# Patient Record
Sex: Male | Born: 1955 | Race: Black or African American | Hispanic: No | Marital: Single | Smoking: Current every day smoker
Health system: Southern US, Community
[De-identification: ages and names within clinical notes are randomized; demographics above are authoritative.]

## PROBLEM LIST (undated history)

## (undated) DIAGNOSIS — I639 Cerebral infarction, unspecified: Secondary | ICD-10-CM

## (undated) DIAGNOSIS — I10 Essential (primary) hypertension: Secondary | ICD-10-CM

## (undated) DIAGNOSIS — E7800 Pure hypercholesterolemia, unspecified: Secondary | ICD-10-CM

## (undated) DIAGNOSIS — F1910 Other psychoactive substance abuse, uncomplicated: Secondary | ICD-10-CM

## (undated) DIAGNOSIS — J439 Emphysema, unspecified: Secondary | ICD-10-CM

## (undated) DIAGNOSIS — H269 Unspecified cataract: Secondary | ICD-10-CM

## (undated) HISTORY — DX: Other psychoactive substance abuse, uncomplicated: F19.10

## (undated) HISTORY — DX: Emphysema, unspecified: J43.9

---

## 2015-02-09 ENCOUNTER — Other Ambulatory Visit (HOSPITAL_COMMUNITY): Payer: Self-pay | Admitting: Nurse Practitioner

## 2015-02-09 DIAGNOSIS — Z720 Tobacco use: Secondary | ICD-10-CM

## 2015-02-18 ENCOUNTER — Ambulatory Visit (HOSPITAL_COMMUNITY)
Admission: RE | Admit: 2015-02-18 | Discharge: 2015-02-18 | Disposition: A | Discharge status: Home / Self Care | Payer: Medicaid Other | Source: Ambulatory Visit | Attending: Nurse Practitioner | Admitting: Nurse Practitioner

## 2015-02-24 ENCOUNTER — Ambulatory Visit (HOSPITAL_COMMUNITY): Payer: Medicaid Other

## 2015-04-08 NOTE — Patient Instructions (Signed)
Your procedure is scheduled on: 04/13/2015  Report to [facility] at  31   AM.  Call this number if you have problems the morning of surgery: [phone]   Do not eat food or drink liquids :After Midnight.      Take these medicines the morning of surgery with A SIP OF WATER: lisinopril   Do not wear jewelry, make-up or nail polish.  Do not wear lotions, powders, or perfumes.   Do not shave 48 hours prior to surgery.  Do not bring valuables to the hospital.  Contacts, dentures or bridgework may not be worn into surgery.  Leave suitcase in the car. After surgery it may be brought to your room.  For patients admitted to the hospital, checkout time is 11:00 AM the day of discharge.   Patients discharged the day of surgery will not be allowed to drive home.  :     Please read over the following fact sheets that you were given: Coughing and Deep Breathing, Surgical Site Infection Prevention, Anesthesia Post-op Instructions and Care and Recovery After Surgery    Cataract A cataract is a clouding of the lens of the eye. When a lens becomes cloudy, vision is reduced based on the degree and nature of the clouding. Many cataracts reduce vision to some degree. Some cataracts make people more near-sighted as they develop. Other cataracts increase glare. Cataracts that are ignored and become worse can sometimes look white. The white color can be seen through the pupil. CAUSES   Aging. However, cataracts may occur at any age, even in newborns.   Certain drugs.   Trauma to the eye.   Certain diseases such as diabetes.   Specific eye diseases such as chronic inflammation inside the eye or a sudden attack of a rare form of glaucoma.   Inherited or acquired medical problems.  SYMPTOMS   Gradual, progressive drop in vision in the affected eye.   Severe, rapid visual loss. This most often happens when trauma is the cause.  DIAGNOSIS  To detect a cataract, an eye doctor examines the lens.  Cataracts are best diagnosed with an exam of the eyes with the pupils enlarged (dilated) by drops.  TREATMENT  For an early cataract, vision may improve by using different eyeglasses or stronger lighting. If that does not help your vision, surgery is the only effective treatment. A cataract needs to be surgically removed when vision loss interferes with your everyday activities, such as driving, reading, or watching TV. A cataract may also have to be removed if it prevents examination or treatment of another eye problem. Surgery removes the cloudy lens and usually replaces it with a substitute lens (intraocular lens, IOL).  At a time when both you and your doctor agree, the cataract will be surgically removed. If you have cataracts in both eyes, only one is usually removed at a time. This allows the operated eye to heal and be out of danger from any possible problems after surgery (such as infection or poor wound healing). In rare cases, a cataract may be doing damage to your eye. In these cases, your caregiver may advise surgical removal right away. The vast majority of people who have cataract surgery have better vision afterward. HOME CARE INSTRUCTIONS  If you are not planning surgery, you may be asked to do the following:  Use different eyeglasses.   Use stronger or brighter lighting.   Ask your eye doctor about reducing your medicine dose or changing medicines  if it is thought that a medicine caused your cataract. Changing medicines does not make the cataract go away on its own.   Become familiar with your surroundings. Poor vision can lead to injury. Avoid bumping into things on the affected side. You are at a higher risk for tripping or falling.   Exercise extreme care when driving or operating machinery.   Wear sunglasses if you are sensitive to bright light or experiencing problems with glare.  SEEK IMMEDIATE MEDICAL CARE IF:   You have a worsening or sudden vision loss.   You notice  redness, swelling, or increasing pain in the eye.   You have a fever.  Document Released: 09/19/2005 Document Revised: 09/08/2011 Document Reviewed: 05/13/2011 [facility] Patient Information 2012 [place].PATIENT INSTRUCTIONS POST-ANESTHESIA  IMMEDIATELY FOLLOWING SURGERY:  Do not drive or operate machinery for the first twenty four hours after surgery.  Do not make any important decisions for twenty four hours after surgery or while taking narcotic pain medications or sedatives.  If you develop intractable nausea and vomiting or a severe headache please notify your doctor immediately.  FOLLOW-UP:  Please make an appointment with your surgeon as instructed. You do not need to follow up with anesthesia unless specifically instructed to do so.  WOUND CARE INSTRUCTIONS (if applicable):  Keep a dry clean dressing on the anesthesia/puncture wound site if there is drainage.  Once the wound has quit draining you may leave it open to air.  Generally you should leave the bandage intact for twenty four hours unless there is drainage.  If the epidural site drains for more than 36-48 hours please call the anesthesia department.  QUESTIONS?:  Please feel free to call your physician or the hospital operator if you have any questions, and they will be happy to assist you.

## 2015-04-09 ENCOUNTER — Emergency Department (HOSPITAL_COMMUNITY): Payer: Medicaid Other

## 2015-04-09 ENCOUNTER — Inpatient Hospital Stay (HOSPITAL_COMMUNITY): Payer: Medicaid Other

## 2015-04-09 ENCOUNTER — Encounter (HOSPITAL_COMMUNITY): Payer: Self-pay

## 2015-04-09 ENCOUNTER — Other Ambulatory Visit: Payer: Self-pay

## 2015-04-09 ENCOUNTER — Inpatient Hospital Stay (HOSPITAL_COMMUNITY)
Admission: EM | Admit: 2015-04-09 | Discharge: 2015-04-10 | DRG: 066 | Disposition: A | Discharge status: Home / Self Care | Payer: Medicaid Other | Attending: Internal Medicine | Admitting: Internal Medicine

## 2015-04-09 ENCOUNTER — Encounter (HOSPITAL_COMMUNITY)
Admission: RE | Admit: 2015-04-09 | Discharge: 2015-04-09 | Disposition: A | Discharge status: Home / Self Care | Payer: Medicaid Other | Source: Ambulatory Visit | Attending: Ophthalmology | Admitting: Ophthalmology

## 2015-04-09 DIAGNOSIS — Z720 Tobacco use: Secondary | ICD-10-CM | POA: Diagnosis present

## 2015-04-09 DIAGNOSIS — E785 Hyperlipidemia, unspecified: Secondary | ICD-10-CM | POA: Diagnosis present

## 2015-04-09 DIAGNOSIS — I10 Essential (primary) hypertension: Secondary | ICD-10-CM | POA: Diagnosis present

## 2015-04-09 DIAGNOSIS — H269 Unspecified cataract: Secondary | ICD-10-CM | POA: Diagnosis present

## 2015-04-09 DIAGNOSIS — F17210 Nicotine dependence, cigarettes, uncomplicated: Secondary | ICD-10-CM | POA: Diagnosis present

## 2015-04-09 DIAGNOSIS — E780 Pure hypercholesterolemia: Secondary | ICD-10-CM | POA: Diagnosis present

## 2015-04-09 DIAGNOSIS — I639 Cerebral infarction, unspecified: Principal | ICD-10-CM | POA: Diagnosis present

## 2015-04-09 DIAGNOSIS — Z8673 Personal history of transient ischemic attack (TIA), and cerebral infarction without residual deficits: Secondary | ICD-10-CM

## 2015-04-09 HISTORY — DX: Unspecified cataract: H26.9

## 2015-04-09 HISTORY — DX: Pure hypercholesterolemia, unspecified: E78.00

## 2015-04-09 HISTORY — DX: Essential (primary) hypertension: I10

## 2015-04-09 LAB — COMPREHENSIVE METABOLIC PANEL
ALBUMIN: 4.2 g/dL (ref 3.5–5.0)
ALT: 38 U/L (ref 17–63)
AST: 24 U/L (ref 15–41)
Alkaline Phosphatase: 59 U/L (ref 38–126)
Anion gap: 8 (ref 5–15)
BILIRUBIN TOTAL: 0.6 mg/dL (ref 0.3–1.2)
BUN: 15 mg/dL (ref 6–20)
CALCIUM: 9.1 mg/dL (ref 8.9–10.3)
CHLORIDE: 102 mmol/L (ref 101–111)
CO2: 26 mmol/L (ref 22–32)
CREATININE: 1.03 mg/dL (ref 0.61–1.24)
GFR calc Af Amer: >60 mL/min (ref >60)
Glucose, Bld: 108 mg/dL — ABNORMAL HIGH (ref 65–99)
Potassium: 4.4 mmol/L (ref 3.5–5.1)
SODIUM: 136 mmol/L (ref 135–145)
Total Protein: 8.1 g/dL (ref 6.5–8.1)

## 2015-04-09 LAB — GLUCOSE, CAPILLARY
GLUCOSE-CAPILLARY: 151 mg/dL — AB (ref 65–99)
Glucose-Capillary: 126 mg/dL — ABNORMAL HIGH (ref 65–99)
Glucose-Capillary: 106 mg/dL — ABNORMAL HIGH (ref 65–99)

## 2015-04-09 LAB — CBC WITH DIFFERENTIAL/PLATELET
BASOS ABS: 0 10*3/uL (ref 0.0–0.1)
Basophils Absolute: 0 10*3/uL (ref 0.0–0.1)
Basophils Relative: 0 % (ref 0–1)
Basophils Relative: 0 % (ref 0–1)
Eosinophils Absolute: 0 10*3/uL (ref 0.0–0.7)
Eosinophils Absolute: 0 10*3/uL (ref 0.0–0.7)
Eosinophils Relative: 0 % (ref 0–5)
Eosinophils Relative: 0 % (ref 0–5)
HCT: 41.4 % (ref 39.0–52.0)
HEMATOCRIT: 42.7 % (ref 39.0–52.0)
HEMOGLOBIN: 14.7 g/dL (ref 13.0–17.0)
HEMOGLOBIN: 14.4 g/dL (ref 13.0–17.0)
LYMPHS ABS: 1.6 10*3/uL (ref 0.7–4.0)
LYMPHS PCT: 32 % (ref 12–46)
LYMPHS PCT: 28 % (ref 12–46)
Lymphs Abs: 1.9 10*3/uL (ref 0.7–4.0)
MCH: 29.2 pg (ref 26.0–34.0)
MCH: 29.3 pg (ref 26.0–34.0)
MCHC: 34.4 g/dL (ref 30.0–36.0)
MCHC: 34.8 g/dL (ref 30.0–36.0)
MCV: 84.9 fL (ref 78.0–100.0)
MCV: 84.3 fL (ref 78.0–100.0)
Monocytes Absolute: 0.5 10*3/uL (ref 0.1–1.0)
Monocytes Absolute: 0.5 10*3/uL (ref 0.1–1.0)
Monocytes Relative: 9 % (ref 3–12)
Monocytes Relative: 9 % (ref 3–12)
NEUTROS ABS: 3.4 10*3/uL (ref 1.7–7.7)
NEUTROS ABS: 3.6 10*3/uL (ref 1.7–7.7)
NEUTROS PCT: 63 % (ref 43–77)
Neutrophils Relative %: 59 % (ref 43–77)
Platelets: 281 10*3/uL (ref 150–400)
Platelets: 256 10*3/uL (ref 150–400)
RBC: 5.03 MIL/uL (ref 4.22–5.81)
RBC: 4.91 MIL/uL (ref 4.22–5.81)
RDW: 13.4 % (ref 11.5–15.5)
RDW: 13.4 % (ref 11.5–15.5)
WBC: 5.9 10*3/uL (ref 4.0–10.5)
WBC: 5.7 10*3/uL (ref 4.0–10.5)

## 2015-04-09 LAB — BASIC METABOLIC PANEL
Anion gap: 7 (ref 5–15)
BUN: 15 mg/dL (ref 6–20)
CHLORIDE: 101 mmol/L (ref 101–111)
CO2: 28 mmol/L (ref 22–32)
Calcium: 9.6 mg/dL (ref 8.9–10.3)
Creatinine, Ser: 1.03 mg/dL (ref 0.61–1.24)
GFR calc non Af Amer: >60 mL/min (ref >60)
GLUCOSE: 114 mg/dL — AB (ref 65–99)
POTASSIUM: 4.8 mmol/L (ref 3.5–5.1)
Sodium: 136 mmol/L (ref 135–145)

## 2015-04-09 LAB — TSH: TSH: 1.092 u[IU]/mL (ref 0.350–4.500)

## 2015-04-09 LAB — VITAMIN B12: VITAMIN B 12: 447 pg/mL (ref 180–914)

## 2015-04-09 MED ORDER — ASPIRIN 325 MG PO TABS
325.0000 mg | ORAL_TABLET | Freq: Every day | ORAL | Status: DC
  Administered 2015-04-10: 325 mg via ORAL
  Filled 2015-04-09: qty 1

## 2015-04-09 MED ORDER — HEPARIN SODIUM (PORCINE) 5000 UNIT/ML IJ SOLN
5000.0000 [IU] | Freq: Three times a day (TID) | INTRAMUSCULAR | Status: DC
  Administered 2015-04-09 – 2015-04-10 (×3): 5000 [IU] via SUBCUTANEOUS
  Filled 2015-04-09 (×3): qty 1

## 2015-04-09 MED ORDER — ASPIRIN-DIPYRIDAMOLE ER 25-200 MG PO CP12
1.0000 | ORAL_CAPSULE | Freq: Two times a day (BID) | ORAL | Status: DC
  Administered 2015-04-09: 1 via ORAL
  Filled 2015-04-09 (×2): qty 1

## 2015-04-09 MED ORDER — STROKE: EARLY STAGES OF RECOVERY BOOK
Freq: Once | Status: AC
  Administered 2015-04-09: 17:00:00
  Filled 2015-04-09: qty 1

## 2015-04-09 MED ORDER — ATORVASTATIN CALCIUM 40 MG PO TABS
80.0000 mg | ORAL_TABLET | Freq: Every day | ORAL | Status: DC
  Administered 2015-04-09: 80 mg via ORAL
  Filled 2015-04-09: qty 2

## 2015-04-09 NOTE — H&P (Signed)
[facility] History and Physical  Drew Cruz XNT:[id number] DOB: [birth year]    PCP:   Alphia Kava   Chief Complaint: left facial numbness and left upper extremity paresthesia.   HPI: Drew Cruz is an 60 y.o. male with hx of HLD, HTN, bilateral cataracts with very poor vision, planned to have cataract surgery soon, presented to the ER with 2 days hx of left facial numbness, left upper extremity numbness with no slurred speech, focal weakness, HA, or changes in his vision.  He has fixed symptoms with ictus about 2 days ago, and is out of window for TPA.  He has been on Lipitor, lower dose, and compliant with his ASA.  CT of his head showe superior right parietal hypodensity of indetermined age, likely subacute or acute infarct.  MRI/MRA of the confirmed new right thalamus infarct, along with prior right thalamus infarct, and showed distal carotid stenosis to 75%, along with moderate to severe A1 and P2 segment stenosis as well.  Hospitalist was asked to admit him for further stroke work up..   Rewiew of Systems:  Constitutional: Negative for malaise, fever and chills. No significant weight loss or weight gain Eyes: Negative for eye pain, redness and discharge, diplopia, visual changes, or flashes of light. ENMT: Negative for ear pain, hoarseness, nasal congestion, sinus pressure and sore throat. No headaches; tinnitus, drooling, or problem swallowing. Cardiovascular: Negative for chest pain, palpitations, diaphoresis, dyspnea and peripheral edema. ; No orthopnea, PND Respiratory: Negative for cough, hemoptysis, wheezing and stridor. No pleuritic chestpain. Gastrointestinal: Negative for nausea, vomiting, diarrhea, constipation, abdominal pain, melena, blood in stool, hematemesis, jaundice and rectal bleeding.    Genitourinary: Negative for frequency, dysuria, incontinence,flank pain and hematuria; Musculoskeletal: Negative for back pain and neck pain. Negative for swelling and  trauma.;  Skin: . Negative for pruritus, rash, abrasions, bruising and skin lesion.; ulcerations Neuro: Negative for headache, lightheadedness and neck stiffness. Negative for weakness, altered level of consciousness , altered mental status, extremity weakness, burning feet, involuntary movement, seizure and syncope.  Psych: negative for anxiety, depression, insomnia, tearfulness, panic attacks, hallucinations, paranoia, suicidal or homicidal ideation    Past Medical History  Diagnosis Date  . Hypertension   . Hypercholesteremia   . Cataracts, bilateral     History reviewed. No pertinent past surgical history.  Medications:  HOME MEDS: Prior to Admission medications   Medication Sig Start Date End Date Taking? Authorizing Provider  atorvastatin (LIPITOR) 20 MG tablet Take 1 tablet by mouth daily. 03/04/15  Yes Historical Provider, MD  [facility] ASPIRIN LOW DOSE 81 MG EC tablet Take 81 mg by mouth daily. 03/04/15  Yes Historical Provider, MD  lisinopril (PRINIVIL,ZESTRIL) 10 MG tablet Take 10 mg by mouth daily. 03/04/15  Yes Historical Provider, MD     Allergies:  No Known Allergies  Social History:   reports that he has been smoking Cigarettes.  He has a 24 pack-year smoking history. He does not have any smokeless tobacco history on file. He reports that he drinks about 14.4 oz of alcohol per week. He reports that he does not use illicit drugs.  Family History: No family history on file.   Physical Exam: Filed Vitals:   04/09/15 1430 04/09/15 1500 04/09/15 1530 04/09/15 1600  BP: 152/102 161/88 131/91 130/93  Pulse: 90 84 103 95  Temp:      TempSrc:      Resp:      Height:      Weight:  SpO2: 98% 94% 96% 97%   Blood pressure 130/93, pulse 95, temperature 98.1 F (36.7 C), temperature source Oral, resp. rate 20, height 5\' 11"  (1.803 m), weight 90.266 kg (199 lb), SpO2 97 %.  GEN:  Pleasant  patient lying in the stretcher in no acute distress; cooperative with exam. PSYCH:   alert and oriented x4; does not appear anxious or depressed; affect is appropriate. HEENT: Mucous membranes pink and anicteric; PERRLA; EOM intact; no cervical lymphadenopathy nor thyromegaly or carotid bruit; no JVD; There were no stridor. Neck is very supple. Breasts:: Not examined CHEST WALL: No tenderness CHEST: Normal respiration, clear to auscultation bilaterally.  HEART: Regular rate and rhythm.  There are no murmur, rub, or gallops.   BACK: No kyphosis or scoliosis; no CVA tenderness ABDOMEN: soft and non-tender; no masses, no organomegaly, normal abdominal bowel sounds; no pannus; no intertriginous candida. There is no rebound and no distention. Rectal Exam: Not done EXTREMITIES: No bone or joint deformity; age-appropriate arthropathy of the hands and knees; no edema; no ulcerations.  There is no calf tenderness. Genitalia: not examined PULSES: 2+ and symmetric SKIN: Normal hydration no rash or ulceration CNS: Cranial nerves 2-12 grossly intact no focal lateralizing neurologic deficit.  Speech is fluent; uvula elevated with phonation, facial symmetry and tongue midline. DTR are normal bilaterally, cerebella exam is intact, barbinski is negative and strengths are equaled bilaterally.  Decreased sensation of the left check and left upper extremities.    Labs on Admission:  Basic Metabolic Panel:  Recent Labs Lab 04/09/15 0945 04/09/15 1102  NA 136 136  K 4.8 4.4  CL 101 102  CO2 28 26  GLUCOSE 114* 108*  BUN 15 15  CREATININE 1.03 1.03  CALCIUM 9.6 9.1   Liver Function Tests:  Recent Labs Lab 04/09/15 1102  AST 24  ALT 38  ALKPHOS 59  BILITOT 0.6  PROT 8.1  ALBUMIN 4.2   No results for input(s): LIPASE, AMYLASE in the last 168 hours. No results for input(s): AMMONIA in the last 168 hours. CBC:  Recent Labs Lab 04/09/15 0945 04/09/15 1102  WBC 5.9 5.7  NEUTROABS 3.4 3.6  HGB 14.7 14.4  HCT 42.7 41.4  MCV 84.9 84.3  PLT 281 256   Cardiac Enzymes: No  results for input(s): CKTOTAL, CKMB, CKMBINDEX, TROPONINI in the last 168 hours.  CBG: No results for input(s): GLUCAP in the last 168 hours.   Radiological Exams on Admission: Ct Head Wo Contrast  04/09/2015   CLINICAL DATA:  59 year old hypertensive male with hypercholesterolemia presenting with left-sided facial and arm numbness. Blurred vision. Symptoms originated 04/06/2015. Initial encounter.  EXAM: CT HEAD WITHOUT CONTRAST  TECHNIQUE: Contiguous axial images were obtained from the base of the skull through the vertex without intravenous contrast.  COMPARISON:  None.  FINDINGS: Superior right parietal hypodensity may represent an infarct of indeterminate age, possibly acute or subacute. MR may be considered for further delineation.  Probable partial volume averaging superior aspect left lateral ventricle (image 21).  No intracranial hemorrhage.  No hydrocephalus.  No intracranial mass lesion noted on this unenhanced exam.  Partial calcification internal carotid artery cavernous segment.  Visualized aspects of the orbits grossly within normal limits.  No calvarial abnormality noted.  IMPRESSION: Superior right parietal hypodensity may represent an infarct of indeterminate age, possibly acute or subacute. MR may be considered for further delineation.   Electronically Signed   By: Genia Del M.D.   On: 04/09/2015 11:04   Mr Jodene Nam  Head Wo Contrast  04/09/2015   CLINICAL DATA:  New onset of left-sided facial numbness and hand numbness beginning 3 days ago.  EXAM: MRI HEAD WITHOUT CONTRAST  MRA HEAD WITHOUT CONTRAST  TECHNIQUE: Multiplanar, multiecho pulse sequences of the brain and surrounding structures were obtained without intravenous contrast. Angiographic images of the head were obtained using MRA technique without contrast.  COMPARISON:  CT head without contrast from the same day.  FINDINGS: MRI HEAD FINDINGS  A 10 mm acute nonhemorrhagic infarct is noted within the lateral right thalamus. T2  changes are associated with the acute infarct. An additional remote lacunar infarct is seen posteriorly in the right thalamus. A remote right parietal lobe infarct is evident. Mild periventricular T2 changes are seen bilaterally.  Flow is present in the major intracranial arteries. The globes and orbits are intact. A posterior left ethmoid air cell is opacified. The paranasal sinuses and mastoid air cells are otherwise clear.  The ventricles are of normal size. No significant extraaxial fluid collection is present.  The skullbase is within normal limits. Midline structures are unremarkable.  MRA HEAD FINDINGS  The left internal carotid artery is within normal limits from the high cervical segments through the ICA terminus. The right internal carotid artery demonstrates moderate stenosis in the distal cavernous segment. The lumen is narrowed to 0.9 mm compared to the immediate poststenotic segment is 3.8 mm. There is moderate stenosis of proximal left A1 segment. The right A1 segment and both M1 segments are normal. The anterior communicating artery is patent. The MCA bifurcations are intact. There is segmental attenuation of distal MCA branch vessels bilaterally. The ACA branch vessels demonstrate mild distal segmental narrowing as well.  The vertebral arteries are codominant. The PICA origins are visualized and normal. The basilar artery is within normal limits. Both posterior cerebral arteries originate from the basilar tip. Moderate P2 segment stenoses are noted bilaterally. There is moderate attenuation of distal PCA branch vessels.  IMPRESSION: 1. 10 mm acute nonhemorrhagic infarct in the lateral right thalamus. 2. Additional remote lacunar infarct of the right thalamus. 3. Remote right parietal lobe infarct. 4. Periventricular T2 changes bilaterally are somewhat advanced, suggesting microvascular ischemic disease. 5. Moderate to severe stenosis of the distal cavernous right internal carotid artery of up to  75%. 6. Moderate to severe proximal left A1 segment stenosis. 7. Moderate P2 segment stenoses bilaterally. 8. Mild diffuse small vessel disease.  These results were called by telephone at the time of interpretation on 04/09/2015 at 2:25 pm to Dr. Milton Ferguson , who verbally acknowledged these results.   Electronically Signed   By: San Morelle M.D.   On: 04/09/2015 14:57   Mr Brain Wo Contrast  04/09/2015   CLINICAL DATA:  New onset of left-sided facial numbness and hand numbness beginning 3 days ago.  EXAM: MRI HEAD WITHOUT CONTRAST  MRA HEAD WITHOUT CONTRAST  TECHNIQUE: Multiplanar, multiecho pulse sequences of the brain and surrounding structures were obtained without intravenous contrast. Angiographic images of the head were obtained using MRA technique without contrast.  COMPARISON:  CT head without contrast from the same day.  FINDINGS: MRI HEAD FINDINGS  A 10 mm acute nonhemorrhagic infarct is noted within the lateral right thalamus. T2 changes are associated with the acute infarct. An additional remote lacunar infarct is seen posteriorly in the right thalamus. A remote right parietal lobe infarct is evident. Mild periventricular T2 changes are seen bilaterally.  Flow is present in the major intracranial arteries. The globes and  orbits are intact. A posterior left ethmoid air cell is opacified. The paranasal sinuses and mastoid air cells are otherwise clear.  The ventricles are of normal size. No significant extraaxial fluid collection is present.  The skullbase is within normal limits. Midline structures are unremarkable.  MRA HEAD FINDINGS  The left internal carotid artery is within normal limits from the high cervical segments through the ICA terminus. The right internal carotid artery demonstrates moderate stenosis in the distal cavernous segment. The lumen is narrowed to 0.9 mm compared to the immediate poststenotic segment is 3.8 mm. There is moderate stenosis of proximal left A1 segment. The  right A1 segment and both M1 segments are normal. The anterior communicating artery is patent. The MCA bifurcations are intact. There is segmental attenuation of distal MCA branch vessels bilaterally. The ACA branch vessels demonstrate mild distal segmental narrowing as well.  The vertebral arteries are codominant. The PICA origins are visualized and normal. The basilar artery is within normal limits. Both posterior cerebral arteries originate from the basilar tip. Moderate P2 segment stenoses are noted bilaterally. There is moderate attenuation of distal PCA branch vessels.  IMPRESSION: 1. 10 mm acute nonhemorrhagic infarct in the lateral right thalamus. 2. Additional remote lacunar infarct of the right thalamus. 3. Remote right parietal lobe infarct. 4. Periventricular T2 changes bilaterally are somewhat advanced, suggesting microvascular ischemic disease. 5. 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 6. Moderate to severe proximal left A1 segment stenosis. 7. Moderate P2 segment stenoses bilaterally. 8. Mild diffuse small vessel disease.  These results were called by telephone at the time of interpretation on 04/09/2015 at 2:25 pm to Dr. Milton Ferguson , who verbally acknowledged these results.   Electronically Signed   By: San Morelle M.D.   On: 04/09/2015 14:57   Assessment/Plan Present on Admission:  . CVA (cerebral infarction) . HTN (hypertension) . Tobacco abuse . Acute CVA (cerebrovascular accident)  PLAN:  Will admit for complete stroke work up.  Since he has been on ASA, and has microvascular ischemic disease as well, will change to Aggrenox.  His statin will be at max dose, so will increase to 80mg  of Lipitor.  Will check for hypercoagulable panel as well.  He is advised to quit smoking.  Will hold his Lisinopril for now.  His BP is controlled.  I have consulted neurology for further recommendation on his cerebral circulation stenosis.   Thanks.   Other plans as per orders.  Code Status: FULL Haskel Khan, MD. [facility] Pager [phone] 7pm to 7am.  04/09/2015, 4:19 PM

## 2015-04-09 NOTE — Pre-Procedure Instructions (Signed)
Pt complained of having numbness in left side of face and lip and numbness and weakness in left hand since 04-06-15. Advised to call Primary MD or to report to the ED for further evaluation. Pt called Dr. Deboraha Sprang office who advised pt to go to the ED. Pt transported via wheelchair to ED accompanied by Idamae Lusher, Nurse Aid and T. Doss, CNA.

## 2015-04-09 NOTE — ED Provider Notes (Signed)
CSN: [id number]     Arrival date & time 04/09/15  1001 History  This chart was scribed for Drew Ferguson, MD by Rayna Sexton, ED scribe. This patient was seen in room APA09/APA09 and the patient's care was started at 10:48 AM.     Chief Complaint  Patient presents with  . Numbness   Patient is a 59 y.o. male presenting with weakness. The history is provided by the patient and a caregiver. No language interpreter was used.  Weakness This is a new problem. The current episode started more than 2 days ago. The problem occurs constantly. The problem has not changed since onset.Nothing aggravates the symptoms. Nothing relieves the symptoms. He has tried nothing for the symptoms.    HPI Comments: Drew Cruz is a 59 y.o. male, with a history of smoking and HTN, who presents to the Emergency Department complaining of generalized, constant, numbness in his left hand and face with onset 3 days ago. He denies trouble ambulating, talking, swallowing or any other associated symptoms. Pt's caregiver confirms no other recent changes in his behavior or symptoms.   Past Medical History  Diagnosis Date  . Hypertension   . Hypercholesteremia   . Cataracts, bilateral    History reviewed. No pertinent past surgical history. No family history on file. History  Substance Use Topics  . Smoking status: Current Every Day Smoker -- 0.50 packs/day for 48 years    Types: Cigarettes  . Smokeless tobacco: Not on file  . Alcohol Use: 14.4 oz/week    24 Cans of beer per week    Review of Systems  Constitutional: Negative for appetite change and fatigue.  HENT: Negative for congestion, ear discharge, sinus pressure and trouble swallowing.   Eyes: Negative for discharge.  Respiratory: Negative for cough.   Gastrointestinal: Negative for diarrhea.  Genitourinary: Negative for frequency and hematuria.  Musculoskeletal: Negative for back pain.  Skin: Negative for rash.  Neurological: Positive for weakness and  numbness. Negative for seizures.  Psychiatric/Behavioral: Negative for hallucinations.      Allergies  Review of patient's allergies indicates no known allergies.  Home Medications   Prior to Admission medications   Medication Sig Start Date End Date Taking? Authorizing Provider  atorvastatin (LIPITOR) 20 MG tablet Take 1 tablet by mouth daily. 03/04/15   Historical Provider, MD  [facility] ASPIRIN LOW DOSE 81 MG EC tablet Take 81 mg by mouth daily. 03/04/15   Historical Provider, MD  lisinopril (PRINIVIL,ZESTRIL) 10 MG tablet Take 10 mg by mouth daily. 03/04/15   Historical Provider, MD   There were no vitals taken for this visit. Physical Exam  Constitutional: He is oriented to person, place, and time. He appears well-developed. He appears lethargic.  HENT:  Head: Normocephalic.  Eyes: Conjunctivae and EOM are normal. No scleral icterus.  Neck: Neck supple. No thyromegaly present.  Cardiovascular: Normal rate and regular rhythm.  Exam reveals no gallop and no friction rub.   No murmur heard. Pulmonary/Chest: No stridor. He has no wheezes. He has no rales. He exhibits no tenderness.  Abdominal: He exhibits no distension. There is no tenderness. There is no rebound.  Musculoskeletal: Normal range of motion. He exhibits no edema.  Lymphadenopathy:    He has no cervical adenopathy.  Neurological: He is oriented to person, place, and time. He appears lethargic. A sensory deficit is present. He exhibits normal muscle tone. Coordination normal.  Mild decreased sensation in left face and left palm;  Skin: No rash noted. No erythema.  Psychiatric: He has a normal mood and affect. His behavior is normal.    ED Course  Procedures  DIAGNOSTIC STUDIES: Oxygen Saturation is 100% on RA, normal by my interpretation.    COORDINATION OF CARE: 10:50 AM Discussed treatment plan with pt at bedside and pt agreed to plan.  Labs Review Labs Reviewed - No data to display  Imaging Review No results  found.   EKG Interpretation None      MDM   Final diagnoses:  None    cva admit  The chart was scribed for me under my direct supervision.  I personally performed the history, physical, and medical decision making and all procedures in the evaluation of this patient.Drew Ferguson, MD 04/09/15 [phone]

## 2015-04-09 NOTE — ED Notes (Signed)
Complain of left hand, arm and left side of face numb since July 4th.

## 2015-04-09 NOTE — Consult Note (Signed)
[place] A. Merlene Laughter, MD     [email or web address]          Drew Cruz is an 59 y.o. male.   ASSESSMENT/PLAN: 1. Acute right thalamic infarct presented with numbness tingling of the left facial region and left upper extremity. Risk factor of hypertension and age. 2. Previous lacunar infarcts. 3. Dyslipidemia. 4. Hypertension. 5. Severe cataracts due for surgery next week.   RECOMMENDATION: I think we should increase the patient's aspirin from 81 mg to 325 mg rather than using Aggrenox. This is likely less expensive than the given his once a day dosing would likely increase compliance.  Blood pressure management and continue with statin medication. Agree with the current workup. Additional labs will be obtained for RPR, vitamin B12 level, thyroid function test and HIV.  I would recommend that the patient's elective surgery be held for the next month until after the acute stroke phase.  The patient is a 59 year old white male who presents with a 3 day history of facial numbness on the left side and left hand numbness. For unknown reasons, the patient did not seek medical attention until today. He decided symmetrical attention because his symptoms were not improving. The patient does not report weakness. He does not report dysarthria or dysphasia. He tells me that he is compliant with his medications including aspirin 81 mg. The patient does not report other symptoms such as headache, dysarthria, dysphagia, dyspnea, chest pain or shortness of breath. The patient does not report any GI or GU symptoms. The review of systems otherwise negative.  GENERAL: This is a very pleasant man in no acute distress.  HEENT: Supple. Atraumatic normocephalic. Severe dense cataracts bilaterally. He tells me that his vision is poor because of this.  ABDOMEN: soft  EXTREMITIES: No edema   BACK: Normal.  SKIN: Normal by inspection.    MENTAL STATUS: Alert and oriented. Speech, language  and cognition are generally intact. Judgment and insight normal.   CRANIAL NERVES: Pupils are R- 4 mm; L- 3 mm both are round and reactive to light and accommodation; extra ocular movements are full, there is no significant nystagmus; visual fields - unable to count fingers bilaterally; upper and lower facial muscles are normal in strength and symmetric, there is no flattening of the nasolabial folds; tongue is midline; uvula is midline; shoulder elevation is normal.  MOTOR: Normal tone, bulk and strength; no pronator drift.  COORDINATION: Left finger to nose is normal, right finger to nose is normal, No rest tremor; no intention tremor; no postural tremor; no bradykinesia.  REFLEXES: Deep tendon reflexes are symmetrical and normal. Babinski reflexes are flexor bilaterally.   SENSATION: Reduced sensation involving the periorbital and cheek region on the left side condition to the left-hand region.     The brain MRI and MRA are reviewed in person. There is acute infarcts in the lateral thalamic new client on the right side on diffusion imaging. More medially there is reduced signal seen on T2 also involving the right thalamic region suggestive of a chronic infarct. Type. There is increased signal seen in the right parietal cortical region on T2/FLAIR imaging suggestive of a prior infarct. There is mild to periventricular and deep white matter leukoencephalopathy somewhat more increased for age. There is also mild to moderate generalized atrophy. There is significant to dropout of the left A1 segment of the ACA. There is also suggestion of reduced signal involving the distal right cavernous ICA although this is an area with the potential  artifact.   Blood pressure 148/89, pulse 95, temperature 98.5 F (36.9 C), temperature source Oral, resp. rate 20, height '5\' 11"'  (1.803 m), weight 88.769 kg (195 lb 11.2 oz), SpO2 98 %.  Past Medical History  Diagnosis Date  . Hypertension   .  Hypercholesteremia   . Cataracts, bilateral     History reviewed. No pertinent past surgical history.  No family history on file.  Social History:  reports that he has been smoking Cigarettes.  He has a 24 pack-year smoking history. He does not have any smokeless tobacco history on file. He reports that he drinks about 14.4 oz of alcohol per week. He reports that he does not use illicit drugs.  Allergies: No Known Allergies  Medications: Prior to Admission medications   Medication Sig Start Date End Date Taking? Authorizing Provider  atorvastatin (LIPITOR) 20 MG tablet Take 1 tablet by mouth daily. 03/04/15  Yes Historical Provider, MD  [facility] ASPIRIN LOW DOSE 81 MG EC tablet Take 81 mg by mouth daily. 03/04/15  Yes Historical Provider, MD  lisinopril (PRINIVIL,ZESTRIL) 10 MG tablet Take 10 mg by mouth daily. 03/04/15  Yes Historical Provider, MD    Scheduled Meds: . dipyridamole-aspirin  1 capsule Oral BID  . heparin  5,000 Units Subcutaneous 3 times per day   Continuous Infusions:  PRN Meds:.     Results for orders placed or performed during the hospital encounter of 04/09/15 (from the past 48 hour(s))  CBC with Differential/Platelet     Status: None   Collection Time: 04/09/15 11:02 AM  Result Value Ref Range   WBC 5.7 4.0 - 10.5 K/uL   RBC 4.91 4.22 - 5.81 MIL/uL   Hemoglobin 14.4 13.0 - 17.0 g/dL   HCT 41.4 39.0 - 52.0 %   MCV 84.3 78.0 - 100.0 fL   MCH 29.3 26.0 - 34.0 pg   MCHC 34.8 30.0 - 36.0 g/dL   RDW 13.4 11.5 - 15.5 %   Platelets 256 150 - 400 K/uL   Neutrophils Relative % 63 43 - 77 %   Neutro Abs 3.6 1.7 - 7.7 K/uL   Lymphocytes Relative 28 12 - 46 %   Lymphs Abs 1.6 0.7 - 4.0 K/uL   Monocytes Relative 9 3 - 12 %   Monocytes Absolute 0.5 0.1 - 1.0 K/uL   Eosinophils Relative 0 0 - 5 %   Eosinophils Absolute 0.0 0.0 - 0.7 K/uL   Basophils Relative 0 0 - 1 %   Basophils Absolute 0.0 0.0 - 0.1 K/uL  Comprehensive metabolic panel     Status: Abnormal    Collection Time: 04/09/15 11:02 AM  Result Value Ref Range   Sodium 136 135 - 145 mmol/L   Potassium 4.4 3.5 - 5.1 mmol/L   Chloride 102 101 - 111 mmol/L   CO2 26 22 - 32 mmol/L   Glucose, Bld 108 (H) 65 - 99 mg/dL   BUN 15 6 - 20 mg/dL   Creatinine, Ser 1.03 0.61 - 1.24 mg/dL   Calcium 9.1 8.9 - 10.3 mg/dL   Total Protein 8.1 6.5 - 8.1 g/dL   Albumin 4.2 3.5 - 5.0 g/dL   AST 24 15 - 41 U/L   ALT 38 17 - 63 U/L   Alkaline Phosphatase 59 38 - 126 U/L   Total Bilirubin 0.6 0.3 - 1.2 mg/dL   GFR calc non Af Amer >60 >60 mL/min   GFR calc Af Amer >60 >60 mL/min    Comment: (  NOTE) The eGFR has been calculated using the CKD EPI equation. This calculation has not been validated in all clinical situations. eGFR's persistently <60 mL/min signify possible Chronic Kidney Disease.    Anion gap 8 5 - 15    Studies/Results:  BRAIN MRI/MRA 1. 10 mm acute nonhemorrhagic infarct in the lateral right thalamus. 2. Additional remote lacunar infarct of the right thalamus. 3. Remote right parietal lobe infarct. 4. Periventricular T2 changes bilaterally are somewhat advanced, suggesting microvascular ischemic disease. 5. 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 6. Moderate to severe proximal left A1 segment stenosis. 7. Moderate P2 segment stenoses bilaterally. 8. Mild diffuse small vessel disease.      Arion Shankles A. Merlene Laughter, M.D.  Diplomate, Tax adviser of Psychiatry and Neurology ( Neurology). 04/09/2015, 6:25 PM

## 2015-04-10 ENCOUNTER — Inpatient Hospital Stay (HOSPITAL_COMMUNITY): Payer: Medicaid Other

## 2015-04-10 ENCOUNTER — Inpatient Hospital Stay (HOSPITAL_BASED_OUTPATIENT_CLINIC_OR_DEPARTMENT_OTHER): Payer: Medicaid Other

## 2015-04-10 DIAGNOSIS — I639 Cerebral infarction, unspecified: Secondary | ICD-10-CM

## 2015-04-10 DIAGNOSIS — Z720 Tobacco use: Secondary | ICD-10-CM

## 2015-04-10 DIAGNOSIS — Z8673 Personal history of transient ischemic attack (TIA), and cerebral infarction without residual deficits: Secondary | ICD-10-CM | POA: Diagnosis not present

## 2015-04-10 DIAGNOSIS — R200 Anesthesia of skin: Secondary | ICD-10-CM | POA: Diagnosis present

## 2015-04-10 DIAGNOSIS — H269 Unspecified cataract: Secondary | ICD-10-CM | POA: Diagnosis present

## 2015-04-10 DIAGNOSIS — F17210 Nicotine dependence, cigarettes, uncomplicated: Secondary | ICD-10-CM | POA: Diagnosis not present

## 2015-04-10 DIAGNOSIS — E780 Pure hypercholesterolemia: Secondary | ICD-10-CM | POA: Diagnosis present

## 2015-04-10 DIAGNOSIS — E785 Hyperlipidemia, unspecified: Secondary | ICD-10-CM | POA: Diagnosis not present

## 2015-04-10 DIAGNOSIS — I10 Essential (primary) hypertension: Secondary | ICD-10-CM | POA: Diagnosis not present

## 2015-04-10 LAB — LIPID PANEL
CHOL/HDL RATIO: 3.3 ratio
CHOLESTEROL: 159 mg/dL (ref 0–200)
HDL: 48 mg/dL (ref >40)
LDL Cholesterol: 87 mg/dL (ref 0–99)
Triglycerides: 121 mg/dL (ref <150)
VLDL: 24 mg/dL (ref 0–40)

## 2015-04-10 LAB — RAPID URINE DRUG SCREEN, HOSP PERFORMED
Amphetamines: NOT DETECTED
BARBITURATES: NOT DETECTED
Benzodiazepines: NOT DETECTED
Cocaine: NOT DETECTED
OPIATES: NOT DETECTED
Tetrahydrocannabinol: NOT DETECTED

## 2015-04-10 LAB — GLUCOSE, CAPILLARY
GLUCOSE-CAPILLARY: 98 mg/dL (ref 65–99)
Glucose-Capillary: 96 mg/dL (ref 65–99)
Glucose-Capillary: 95 mg/dL (ref 65–99)

## 2015-04-10 LAB — ANTITHROMBIN III: AntiThromb III Func: 122 % — ABNORMAL HIGH (ref 75–120)

## 2015-04-10 LAB — HEMOGLOBIN A1C
Hgb A1c MFr Bld: 5.7 % — ABNORMAL HIGH (ref 4.8–5.6)
Mean Plasma Glucose: 117 mg/dL

## 2015-04-10 MED ORDER — ASPIRIN 325 MG PO TABS
325.0000 mg | ORAL_TABLET | Freq: Every day | ORAL | Status: DC
Start: 2015-04-10 — End: 2022-02-07

## 2015-04-10 MED ORDER — ATORVASTATIN CALCIUM 80 MG PO TABS: 80.0000 mg | ORAL_TABLET | Freq: Every day | ORAL | Status: DC

## 2015-04-10 NOTE — Care Management Note (Signed)
Case Management Note  Patient Details  Name: Drew Cruz MRN: [id number] Date of Birth: 08-Jun-1956  Subjective/Objective:                    Action/Plan:   Expected Discharge Date:                  Expected Discharge Plan:  Home/Self Care  In-House Referral:  NA  Discharge planning Services  CM Consult  Post Acute Care Choice:  NA Choice offered to:  NA  DME Arranged:    DME Agency:     HH Arranged:    [place] Agency:     Status of Service:  Completed, signed off  Medicare Important Message Given:    Date Medicare IM Given:    Medicare IM give by:    Date Additional Medicare IM Given:    Additional Medicare Important Message give by:     If discussed at [place] of Stay Meetings, dates discussed:    Additional Comments: PT recommends home health PT or outpt PT but pt is not interested at this time. If pt changes his mind, weekend staff can arrange with agency of choice. Christinia Gully [place], RN 04/10/2015, 4:37 PM

## 2015-04-10 NOTE — Plan of Care (Signed)
Problem: Progression Outcomes Goal: Communication method established Outcome: Not Applicable Date Met:  15/93/01 No problem with communication Goal: Pain controlled Outcome: Completed/Met Date Met:  04/10/15 denies Goal: Educational plan initiated Outcome: Completed/Met Date Met:  04/10/15 Mapping stroke care booklet to patient and family

## 2015-04-10 NOTE — Evaluation (Signed)
Physical Therapy Evaluation Patient Details Name: Drew Cruz MRN: [id number] DOB: 1956-02-12 Today's Date: 04/10/2015   History of Present Illness  Drew Cruz is an 59 y.o. male with hx of HLD, HTN, bilateral cataracts with very poor vision, planned to have cataract surgery soon, presented to the ER with 2 days hx of left facial numbness, left upper extremity numbness with no slurred speech, focal weakness, HA, or changes in his vision. He has fixed symptoms with ictus about 2 days ago, and is out of window for TPA. He has been on Lipitor, lower dose, and compliant with his ASA. CT of his head showe superior right parietal hypodensity of indetermined age, likely subacute or acute infarct. MRI/MRA of the confirmed new right thalamus infarct, along with prior right thalamus infarct, and showed distal carotid stenosis to 75%, along with moderate to severe A1 and P2 segment stenosis as well. Hospitalist was asked to admit him for further stroke work up..   Clinical Impression  Patient found supine in bed, stating that MD said he could go home soon but agreeable to participate in skilled PT session. Strength full in bilateral LEs. Patient able to complete toileting with supervision. Patient did demonstrate some unsteadiness during gait but states that this is his baseline for walking; also required cues to avoid obstacles in hallway and for safe navigation. Patient also displayed impaired balance via reduced SLS and tandem stance times, states his balance was better before this admission to hospital. Educated patient that he may benefit from either HHPT or OP PT services due to reduced balance and safety however patient did not voice interest in either service at this time, stating that he was confident he will be able to manage at home and will have his aide help him with balance and mobility. Patient is not in need of further skilled PT services during his stay in this facility, but again may benefit from  HHPT or OP PT if he will agree to either.     Follow Up Recommendations Home health PT;Outpatient PT (patient may benefit from HHPT or OP PT but did not voice interest in either at time of eval )    Equipment Recommendations  None recommended by PT    Recommendations for Other Services       Precautions / Restrictions Precautions Precautions: Fall;Other (comment) Precaution Comments: poor vision  Restrictions Weight Bearing Restrictions: No      Mobility  Bed Mobility Overal bed mobility: Independent                Transfers Overall transfer level: Independent Equipment used: None                Ambulation/Gait Ambulation/Gait assistance: Supervision Ambulation Distance (Feet): 250 Feet Assistive device: None Gait Pattern/deviations: Step-through pattern;Decreased step length - right;Decreased step length - left;Decreased dorsiflexion - right;Decreased dorsiflexion - left        Stairs            Wheelchair Mobility    Modified Rankin (Stroke Patients Only)       Balance Overall balance assessment: Needs assistance Sitting-balance support: No upper extremity supported Sitting balance-Leahy Scale: Good     Standing balance support: No upper extremity supported Standing balance-Leahy Scale: Fair Standing balance comment: min unsteadiness during gait however patient states this is his baseline  Single Leg Stance - Right Leg: 2 Single Leg Stance - Left Leg: 3 Tandem Stance - Right Leg: 8 Tandem Stance - Left Leg: 6  Pertinent Vitals/Pain Pain Assessment: No/denies pain    Home Living Family/patient expects to be discharged to:: Private residence Living Arrangements: Parent;Other relatives;Other (Comment) (aide 7 days/week ) Available Help at Discharge: Family;Personal care attendant (aide available 7 days a week for 2-3 hours a day ) Type of Home: House Home Access: Ramped entrance     Home Layout: One  level Home Equipment: None      Prior Function Level of Independence: Independent               Hand Dominance        Extremity/Trunk Assessment   Upper Extremity Assessment: Defer to OT evaluation           Lower Extremity Assessment: Overall WFL for tasks assessed      Cervical / Trunk Assessment: Normal  Communication   Communication: No difficulties  Cognition Arousal/Alertness: Awake/alert Behavior During Therapy: WFL for tasks assessed/performed Overall Cognitive Status: Within Functional Limits for tasks assessed                      General Comments      Exercises        Assessment/Plan    PT Assessment All further PT needs can be met in the next venue of care  PT Diagnosis Abnormality of gait   PT Problem List Decreased strength;Decreased coordination;Decreased balance;Decreased safety awareness;Decreased mobility  PT Treatment Interventions     PT Goals (Current goals can be found in the Care Plan section) Acute Rehab PT Goals Patient Stated Goal: to go home  PT Goal Formulation: With patient Time For Goal Achievement: 04/24/15 Potential to Achieve Goals: Good    Frequency     Barriers to discharge        Co-evaluation               End of Session Equipment Utilized During Treatment: Gait belt Activity Tolerance: Patient tolerated treatment well Patient left: in bed;with call bell/phone within reach;with bed alarm set           Time: 3532-9924 PT Time Calculation (min) (ACUTE ONLY): 18 min   Charges:   PT Evaluation $Initial PT Evaluation Tier I: 1 Procedure     PT G Codes:        Deniece Ree PT, DPT [phone]

## 2015-04-10 NOTE — Care Management Note (Signed)
Case Management Note  Patient Details  Name: Drew Cruz MRN: [id number] Date of Birth: Dec 03, 1955  Subjective/Objective:                  Pt admitted from home with CVA. Pt lives with family and will return home at discharge. Pt has a CAP aide 7 days a week for 2-3 hours a day. Pt has been independent with ADL's.  Action/Plan: Awaiting PT consult. Will arrange any follow up PT needs once evaluated.  Expected Discharge Date:    04/11/15              Expected Discharge Plan:  Home/Self Care  In-House Referral:  NA  Discharge planning Services  CM Consult  Post Acute Care Choice:  NA Choice offered to:  NA  DME Arranged:    DME Agency:     HH Arranged:    HH Agency:     Status of Service:  Completed, signed off  Medicare Important Message Given:    Date Medicare IM Given:    Medicare IM give by:    Date Additional Medicare IM Given:    Additional Medicare Important Message give by:     If discussed at [place] of Stay Meetings, dates discussed:    Additional Comments:  Joylene Draft, RN 04/10/2015, 11:45 AM

## 2015-04-10 NOTE — Discharge Summary (Signed)
Physician Discharge Summary  Drew Cruz RFF:[id number] DOB: [birth year] DOA: 04/09/2015  PCP: [place] date: 04/09/2015 Discharge date: 04/10/2015  Time spent: 35 minutes  Recommendations for Outpatient Follow-up:  1. Follow up with neurology in 2-3 weeks.   Discharge Diagnoses:  Principal Problem:   CVA (cerebral infarction) Active Problems:   Hyperlipidemia   HTN (hypertension)   Tobacco abuse   Acute CVA (cerebrovascular accident)   Discharge Condition: stable.   Diet recommendation: cardiac.  Filed Weights   04/09/15 1010 04/09/15 1757  Weight: 90.266 kg (199 lb) 88.769 kg (195 lb 11.2 oz)    History of present illness: patient was admitted to the hospital with subacute CVA by me on April 09, 2015.  As per my prior H and P:  " Drew Cruz is an 59 y.o. male with hx of HLD, HTN, bilateral cataracts with very poor vision, planned to have cataract surgery soon, presented to the ER with 2 days hx of left facial numbness, left upper extremity numbness with no slurred speech, focal weakness, HA, or changes in his vision. He has fixed symptoms with ictus about 2 days ago, and is out of window for TPA. He has been on Lipitor, lower dose, and compliant with his ASA. CT of his head showe superior right parietal hypodensity of indetermined age, likely subacute or acute infarct. MRI/MRA of the confirmed new right thalamus infarct, along with prior right thalamus infarct, and showed distal carotid stenosis to 75%, along with moderate to severe A1 and P2 segment stenosis as well. Hospitalist was asked to admit him for further stroke work up.Marland Kitchen   Hospital Course: Patient was admitted for complete CVA work up.  He has been on 81mg  of ASA per day, and lower dose of statin and had been compliant.  He was switch over to Aggrenox BID.  He had MRI/MRA showing stenosis of A1 and P2 segments, and showed prior right parietal and right thalamus infarct, along with new acute right  thalamic infarct.  His neurological condition did not deteriorate.  He was seen in consultation with Dr Merlene Laughter, who recommended that Aggrenox be changed to full dose ASA, for cost containment, and easier dosing schedule.  This was implemented.  He also has full set of thrombophilic work up at this time, and they are pending.  Dr Merlene Laughter also obtained HIV, B12, TSH, and RPR.  B12, TSH were normal, the rest were pending.  He was seen in consultation with physical therapy, who felt that he can be discharged home.  A carotid doppler was obtained, showing plaques but non obstructive stenosis.  His ECHO was also performed and is pending at the time of this note.  For his statin, it was increased to maximum dose of Lipitor 80mg  per day.  He was recommended to wait at least a month for his planned elective cataract surgery.  He is anxious to go home, and is stable for discharge today.  He will follow up with his PCP in one week, and follow up with neurology in 2-3 weeks.  Thank you for allowing me to participate in his care.    Procedures:  MRI/MRA/carotidUS/ECHO/CThead.   Consultations:  Neurology Dr Merlene Laughter.   Discharge Exam: Filed Vitals:   04/10/15 1522  BP: 139/89  Pulse: 73  Temp: 98.1 F (36.7 C)  Resp: 20   Discharge Instructions   Discharge Instructions    Diet - low sodium heart healthy    Complete by:  As  directed      Discharge instructions    Complete by:  As directed   Please follow up with Dr Merlene Laughter as you still have pending labs and needs follow up. Postpone your cataract surgery for one month if possible.     Increase activity slowly    Complete by:  As directed           Current Discharge Medication List    START taking these medications   Details  aspirin 325 MG tablet Take 1 tablet (325 mg total) by mouth daily. Qty: 100 tablet, Refills: 3      CONTINUE these medications which have CHANGED   Details  atorvastatin (LIPITOR) 80 MG tablet Take 1 tablet (80 mg  total) by mouth daily at 6 PM. Qty: 30 tablet, Refills: 1      CONTINUE these medications which have NOT CHANGED   Details  lisinopril (PRINIVIL,ZESTRIL) 10 MG tablet Take 10 mg by mouth daily. Refills: 11      STOP taking these medications     [facility] ASPIRIN LOW DOSE 81 MG EC tablet        No Known Allergies    The results of significant diagnostics from this hospitalization (including imaging, microbiology, ancillary and laboratory) are listed below for reference.    Significant Diagnostic Studies: Ct Head Wo Contrast  04/09/2015   CLINICAL DATA:  59 year old hypertensive male with hypercholesterolemia presenting with left-sided facial and arm numbness. Blurred vision. Symptoms originated 04/06/2015. Initial encounter.  EXAM: CT HEAD WITHOUT CONTRAST  TECHNIQUE: Contiguous axial images were obtained from the base of the skull through the vertex without intravenous contrast.  COMPARISON:  None.  FINDINGS: Superior right parietal hypodensity may represent an infarct of indeterminate age, possibly acute or subacute. MR may be considered for further delineation.  Probable partial volume averaging superior aspect left lateral ventricle (image 21).  No intracranial hemorrhage.  No hydrocephalus.  No intracranial mass lesion noted on this unenhanced exam.  Partial calcification internal carotid artery cavernous segment.  Visualized aspects of the orbits grossly within normal limits.  No calvarial abnormality noted.  IMPRESSION: Superior right parietal hypodensity may represent an infarct of indeterminate age, possibly acute or subacute. MR may be considered for further delineation.   Electronically Signed   By: Genia Del M.D.   On: 04/09/2015 11:04   Mr Jodene Nam Head Wo Contrast  04/09/2015   CLINICAL DATA:  New onset of left-sided facial numbness and hand numbness beginning 3 days ago.  EXAM: MRI HEAD WITHOUT CONTRAST  MRA HEAD WITHOUT CONTRAST  TECHNIQUE: Multiplanar, multiecho pulse sequences of  the brain and surrounding structures were obtained without intravenous contrast. Angiographic images of the head were obtained using MRA technique without contrast.  COMPARISON:  CT head without contrast from the same day.  FINDINGS: MRI HEAD FINDINGS  A 10 mm acute nonhemorrhagic infarct is noted within the lateral right thalamus. T2 changes are associated with the acute infarct. An additional remote lacunar infarct is seen posteriorly in the right thalamus. A remote right parietal lobe infarct is evident. Mild periventricular T2 changes are seen bilaterally.  Flow is present in the major intracranial arteries. The globes and orbits are intact. A posterior left ethmoid air cell is opacified. The paranasal sinuses and mastoid air cells are otherwise clear.  The ventricles are of normal size. No significant extraaxial fluid collection is present.  The skullbase is within normal limits. Midline structures are unremarkable.  MRA HEAD FINDINGS  The left  internal carotid artery is within normal limits from the high cervical segments through the ICA terminus. The right internal carotid artery demonstrates moderate stenosis in the distal cavernous segment. The lumen is narrowed to 0.9 mm compared to the immediate poststenotic segment is 3.8 mm. There is moderate stenosis of proximal left A1 segment. The right A1 segment and both M1 segments are normal. The anterior communicating artery is patent. The MCA bifurcations are intact. There is segmental attenuation of distal MCA branch vessels bilaterally. The ACA branch vessels demonstrate mild distal segmental narrowing as well.  The vertebral arteries are codominant. The PICA origins are visualized and normal. The basilar artery is within normal limits. Both posterior cerebral arteries originate from the basilar tip. Moderate P2 segment stenoses are noted bilaterally. There is moderate attenuation of distal PCA branch vessels.  IMPRESSION: 1. 10 mm acute nonhemorrhagic  infarct in the lateral right thalamus. 2. Additional remote lacunar infarct of the right thalamus. 3. Remote right parietal lobe infarct. 4. Periventricular T2 changes bilaterally are somewhat advanced, suggesting microvascular ischemic disease. 5. 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 6. Moderate to severe proximal left A1 segment stenosis. 7. Moderate P2 segment stenoses bilaterally. 8. Mild diffuse small vessel disease.  These results were called by telephone at the time of interpretation on 04/09/2015 at 2:25 pm to Dr. Milton Ferguson , who verbally acknowledged these results.   Electronically Signed   By: San Morelle M.D.   On: 04/09/2015 14:57   Mr Brain Wo Contrast  04/09/2015   CLINICAL DATA:  New onset of left-sided facial numbness and hand numbness beginning 3 days ago.  EXAM: MRI HEAD WITHOUT CONTRAST  MRA HEAD WITHOUT CONTRAST  TECHNIQUE: Multiplanar, multiecho pulse sequences of the brain and surrounding structures were obtained without intravenous contrast. Angiographic images of the head were obtained using MRA technique without contrast.  COMPARISON:  CT head without contrast from the same day.  FINDINGS: MRI HEAD FINDINGS  A 10 mm acute nonhemorrhagic infarct is noted within the lateral right thalamus. T2 changes are associated with the acute infarct. An additional remote lacunar infarct is seen posteriorly in the right thalamus. A remote right parietal lobe infarct is evident. Mild periventricular T2 changes are seen bilaterally.  Flow is present in the major intracranial arteries. The globes and orbits are intact. A posterior left ethmoid air cell is opacified. The paranasal sinuses and mastoid air cells are otherwise clear.  The ventricles are of normal size. No significant extraaxial fluid collection is present.  The skullbase is within normal limits. Midline structures are unremarkable.  MRA HEAD FINDINGS  The left internal  carotid artery is within normal limits from the high cervical segments through the ICA terminus. The right internal carotid artery demonstrates moderate stenosis in the distal cavernous segment. The lumen is narrowed to 0.9 mm compared to the immediate poststenotic segment is 3.8 mm. There is moderate stenosis of proximal left A1 segment. The right A1 segment and both M1 segments are normal. The anterior communicating artery is patent. The MCA bifurcations are intact. There is segmental attenuation of distal MCA branch vessels bilaterally. The ACA branch vessels demonstrate mild distal segmental narrowing as well.  The vertebral arteries are codominant. The PICA origins are visualized and normal. The basilar artery is within normal limits. Both posterior cerebral arteries originate from the basilar tip. Moderate P2 segment stenoses are noted bilaterally. There is moderate attenuation of distal PCA branch  vessels.  IMPRESSION: 1. 10 mm acute nonhemorrhagic infarct in the lateral right thalamus. 2. Additional remote lacunar infarct of the right thalamus. 3. Remote right parietal lobe infarct. 4. Periventricular T2 changes bilaterally are somewhat advanced, suggesting microvascular ischemic disease. 5. 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 6. Moderate to severe proximal left A1 segment stenosis. 7. Moderate P2 segment stenoses bilaterally. 8. Mild diffuse small vessel disease.  These results were called by telephone at the time of interpretation on 04/09/2015 at 2:25 pm to Dr. Milton Ferguson , who verbally acknowledged these results.   Electronically Signed   By: San Morelle M.D.   On: 04/09/2015 14:57   US Carotid Duplex Bilateral  04/10/2015   CLINICAL DATA:  Stroke.  Syncope.  Hypertension, tobacco abuse.  EXAM: BILATERAL CAROTID DUPLEX ULTRASOUND  TECHNIQUE: Pearline Cables scale imaging, color Doppler and duplex ultrasound was performed of bilateral carotid  and vertebral arteries in the neck.  COMPARISON:  None.  REVIEW OF SYSTEMS: Quantification of carotid stenosis is based on velocity parameters that correlate the residual internal carotid diameter with NASCET-based stenosis levels, using the diameter of the distal internal carotid lumen as the denominator for stenosis measurement.  The following velocity measurements were obtained:  PEAK SYSTOLIC/END DIASTOLIC  RIGHT  ICA:                     86/14cm/sec  CCA:                     295/28UX/LKG  SYSTOLIC ICA/CCA RATIO:  0.7  DIASTOLIC ICA/CCA RATIO: 0.7  ECA:                     152cm/sec  LEFT  ICA:                     180/39cm/sec  CCA:                     401/02VO/ZDG  SYSTOLIC ICA/CCA RATIO:  1.0  DIASTOLIC ICA/CCA RATIO: 1.2  ECA:                     124cm/sec  FINDINGS: RIGHT CAROTID ARTERY: Intimal thickening in the common carotid artery. There is some circumferential plaque in the bulb with scattered areas of calcification, extending into the proximal ICA. No high-grade stenosis. Normal waveforms and color Doppler signal.  RIGHT VERTEBRAL ARTERY:  Normal flow direction and waveform.  LEFT CAROTID ARTERY: Circumferential plaque in the bulb, mildly irregular, with scattered calcifications, extending into the proximal and mid ICA. No high-grade stenosis. Normal waveforms and color Doppler signal.  LEFT VERTEBRAL ARTERY: Normal flow direction and waveform.  IMPRESSION: 1. Bilateral carotid bifurcation and proximal ICA plaque left greater than right, resulting in less than 50% diameter stenosis. The exam does not exclude plaque ulceration or embolization. Continued surveillance recommended.   Electronically Signed   By: Lucrezia Europe M.D.   On: 04/10/2015 14:30    Microbiology: No results found for this or any previous visit (from the past 240 hour(s)).   Labs: Basic Metabolic Panel:  Recent Labs Lab 04/09/15 0945 04/09/15 1102  NA 136 136  K 4.8 4.4  CL 101 102  CO2 28 26  GLUCOSE 114* 108*  BUN 15  15  CREATININE 1.03 1.03  CALCIUM 9.6 9.1   Liver Function Tests:  Recent Labs Lab 04/09/15  1102  AST 24  ALT 38  ALKPHOS 59  BILITOT 0.6  PROT 8.1  ALBUMIN 4.2   No results for input(s): LIPASE, AMYLASE in the last 168 hours. No results for input(s): AMMONIA in the last 168 hours. CBC:  Recent Labs Lab 04/09/15 0945 04/09/15 1102  WBC 5.9 5.7  NEUTROABS 3.4 3.6  HGB 14.7 14.4  HCT 42.7 41.4  MCV 84.9 84.3  PLT 281 256   CBG:  Recent Labs Lab 04/09/15 1801 04/09/15 1852 04/09/15 2203 04/10/15 0714 04/10/15 1125  GLUCAP 151* 126* 106* 98 96   Signed:  Juliane Guest  [facility] 04/10/2015, 4:18 PM

## 2015-04-11 LAB — HIV ANTIBODY (ROUTINE TESTING W REFLEX): HIV Screen 4th Generation wRfx: NONREACTIVE

## 2015-04-11 LAB — RPR: RPR: NONREACTIVE

## 2015-04-12 LAB — HOMOCYSTEINE: Homocysteine: 14.3 umol/L (ref 0.0–15.0)

## 2015-04-13 ENCOUNTER — Ambulatory Visit (HOSPITAL_COMMUNITY): Admission: RE | Admit: 2015-04-13 | Payer: Medicaid Other | Source: Ambulatory Visit | Admitting: Ophthalmology

## 2015-04-13 ENCOUNTER — Encounter (HOSPITAL_COMMUNITY): Admission: RE | Payer: Self-pay | Source: Ambulatory Visit

## 2015-04-13 LAB — BETA-2-GLYCOPROTEIN I ABS, IGG/M/A: Beta-2-Glycoprotein I IgA: <9 GPI IgA units (ref 0–25)

## 2015-04-13 LAB — PROTEIN C, TOTAL: Protein C, Total: 102 % (ref 70–140)

## 2015-04-13 SURGERY — PHACOEMULSIFICATION, CATARACT, WITH IOL INSERTION
Anesthesia: Monitor Anesthesia Care | Site: Eye | Laterality: Right

## 2015-04-14 LAB — LUPUS ANTICOAGULANT PANEL
DRVVT: 40.8 s (ref 0.0–55.1)
PTT LA: 39.8 s (ref 0.0–50.0)

## 2015-04-14 LAB — PROTEIN S ACTIVITY: Protein S Activity: 113 % (ref 60–145)

## 2015-04-14 LAB — CARDIOLIPIN ANTIBODIES, IGG, IGM, IGA
ANTICARDIOLIPIN IGM: 28 [MPL'U]/mL — AB (ref 0–12)
Anticardiolipin IgA: <9 APL U/mL (ref 0–11)

## 2015-04-14 LAB — PROTEIN C ACTIVITY: PROTEIN C ACTIVITY: 137 % (ref 74–151)

## 2015-04-14 LAB — PROTEIN S, TOTAL: Protein S Ag, Total: 130 % (ref 58–150)

## 2015-04-15 LAB — PROTHROMBIN GENE MUTATION

## 2015-04-15 LAB — FACTOR 5 LEIDEN

## 2015-05-19 ENCOUNTER — Encounter (HOSPITAL_COMMUNITY): Payer: Self-pay

## 2015-05-20 ENCOUNTER — Encounter (HOSPITAL_COMMUNITY)
Admission: RE | Admit: 2015-05-20 | Discharge: 2015-05-20 | Disposition: A | Discharge status: Home / Self Care | Payer: Medicaid Other | Source: Ambulatory Visit | Attending: Ophthalmology | Admitting: Ophthalmology

## 2015-05-22 MED ORDER — LIDOCAINE HCL 3.5 % OP GEL
OPHTHALMIC | Status: AC
  Filled 2015-05-22: qty 1

## 2015-05-22 MED ORDER — NEOMYCIN-POLYMYXIN-DEXAMETH 3.5-10000-0.1 OP SUSP
OPHTHALMIC | Status: AC
  Filled 2015-05-22: qty 5

## 2015-05-22 MED ORDER — PHENYLEPHRINE HCL 2.5 % OP SOLN
OPHTHALMIC | Status: AC
  Filled 2015-05-22: qty 15

## 2015-05-22 MED ORDER — TETRACAINE HCL 0.5 % OP SOLN
OPHTHALMIC | Status: AC
  Filled 2015-05-22: qty 2

## 2015-05-22 MED ORDER — CYCLOPENTOLATE-PHENYLEPHRINE OP SOLN OPTIME - NO CHARGE
OPHTHALMIC | Status: AC
  Filled 2015-05-22: qty 2

## 2015-05-22 MED ORDER — LIDOCAINE HCL (PF) 1 % IJ SOLN
INTRAMUSCULAR | Status: AC
Start: 2015-05-22 — End: 2015-05-22
  Filled 2015-05-22: qty 2

## 2015-05-25 ENCOUNTER — Encounter (HOSPITAL_COMMUNITY): Payer: Self-pay | Admitting: *Deleted

## 2015-05-25 ENCOUNTER — Ambulatory Visit (HOSPITAL_COMMUNITY): Payer: Medicaid Other | Admitting: Anesthesiology

## 2015-05-25 ENCOUNTER — Encounter (HOSPITAL_COMMUNITY)
Admission: RE | Disposition: A | Discharge status: Home / Self Care | Payer: Self-pay | Source: Ambulatory Visit | Attending: Ophthalmology

## 2015-05-25 ENCOUNTER — Ambulatory Visit (HOSPITAL_COMMUNITY)
Admission: RE | Admit: 2015-05-25 | Discharge: 2015-05-25 | Disposition: A | Discharge status: Home / Self Care | Payer: Medicaid Other | Source: Ambulatory Visit | Attending: Ophthalmology | Admitting: Ophthalmology

## 2015-05-25 DIAGNOSIS — E780 Pure hypercholesterolemia: Secondary | ICD-10-CM | POA: Insufficient documentation

## 2015-05-25 DIAGNOSIS — Z7982 Long term (current) use of aspirin: Secondary | ICD-10-CM | POA: Insufficient documentation

## 2015-05-25 DIAGNOSIS — I10 Essential (primary) hypertension: Secondary | ICD-10-CM | POA: Insufficient documentation

## 2015-05-25 DIAGNOSIS — F17200 Nicotine dependence, unspecified, uncomplicated: Secondary | ICD-10-CM | POA: Insufficient documentation

## 2015-05-25 DIAGNOSIS — H2521 Age-related cataract, morgagnian type, right eye: Secondary | ICD-10-CM | POA: Diagnosis present

## 2015-05-25 DIAGNOSIS — Z79899 Other long term (current) drug therapy: Secondary | ICD-10-CM | POA: Insufficient documentation

## 2015-05-25 HISTORY — PX: CATARACT EXTRACTION W/PHACO: SHX586

## 2015-05-25 SURGERY — PHACOEMULSIFICATION, CATARACT, WITH IOL INSERTION
Anesthesia: Monitor Anesthesia Care | Site: Eye | Laterality: Right

## 2015-05-25 MED ORDER — PHENYLEPHRINE HCL 2.5 % OP SOLN
1.0000 [drp] | OPHTHALMIC | Status: AC
  Administered 2015-05-25 (×3): 1 [drp] via OPHTHALMIC

## 2015-05-25 MED ORDER — TETRACAINE HCL 0.5 % OP SOLN
1.0000 [drp] | OPHTHALMIC | Status: AC
Start: 2015-05-25 — End: 2015-05-25
  Administered 2015-05-25 (×3): 1 [drp] via OPHTHALMIC

## 2015-05-25 MED ORDER — LACTATED RINGERS IV SOLN
INTRAVENOUS | Status: DC
  Administered 2015-05-25: 1000 mL via INTRAVENOUS

## 2015-05-25 MED ORDER — FENTANYL CITRATE (PF) 100 MCG/2ML IJ SOLN
INTRAMUSCULAR | Status: AC
  Filled 2015-05-25: qty 2

## 2015-05-25 MED ORDER — MIDAZOLAM HCL 2 MG/2ML IJ SOLN
1.0000 mg | INTRAMUSCULAR | Status: DC | PRN
Start: 2015-05-25 — End: 2015-05-25
  Administered 2015-05-25: 2 mg via INTRAVENOUS

## 2015-05-25 MED ORDER — EPINEPHRINE HCL 1 MG/ML IJ SOLN
INTRAMUSCULAR | Status: AC
  Filled 2015-05-25: qty 1

## 2015-05-25 MED ORDER — FENTANYL CITRATE (PF) 100 MCG/2ML IJ SOLN
25.0000 ug | INTRAMUSCULAR | Status: AC
  Administered 2015-05-25 (×2): 25 ug via INTRAVENOUS

## 2015-05-25 MED ORDER — LIDOCAINE HCL (PF) 1 % IJ SOLN
INTRAMUSCULAR | Status: DC | PRN
  Administered 2015-05-25: .7 mL

## 2015-05-25 MED ORDER — POVIDONE-IODINE 5 % OP SOLN
OPHTHALMIC | Status: DC | PRN
  Administered 2015-05-25: 1 via OPHTHALMIC

## 2015-05-25 MED ORDER — PHENYLEPHRINE-KETOROLAC 1-0.3 % IO SOLN
INTRAOCULAR | Status: AC
  Filled 2015-05-25: qty 4

## 2015-05-25 MED ORDER — EPINEPHRINE HCL 1 MG/ML IJ SOLN
INTRAOCULAR | Status: DC | PRN
  Administered 2015-05-25: 500 mL

## 2015-05-25 MED ORDER — BSS IO SOLN
INTRAOCULAR | Status: DC | PRN
  Administered 2015-05-25: 15 mL

## 2015-05-25 MED ORDER — MIDAZOLAM HCL 2 MG/2ML IJ SOLN
INTRAMUSCULAR | Status: AC
  Filled 2015-05-25: qty 2

## 2015-05-25 MED ORDER — NEOMYCIN-POLYMYXIN-DEXAMETH 3.5-10000-0.1 OP SUSP
OPHTHALMIC | Status: DC | PRN
  Administered 2015-05-25: 2 [drp] via OPHTHALMIC

## 2015-05-25 MED ORDER — PROVISC 10 MG/ML IO SOLN
INTRAOCULAR | Status: DC | PRN
  Administered 2015-05-25: 0.85 mL via INTRAOCULAR

## 2015-05-25 MED ORDER — LIDOCAINE 3.5 % OP GEL OPTIME - NO CHARGE
OPHTHALMIC | Status: DC | PRN
Start: 2015-05-25 — End: 2015-05-25
  Administered 2015-05-25: 1 [drp] via OPHTHALMIC

## 2015-05-25 MED ORDER — TRYPAN BLUE 0.06 % OP SOLN
OPHTHALMIC | Status: DC | PRN
  Administered 2015-05-25: 0.5 mL via INTRAOCULAR

## 2015-05-25 MED ORDER — TRYPAN BLUE 0.06 % OP SOLN
OPHTHALMIC | Status: AC
  Filled 2015-05-25: qty 0.5

## 2015-05-25 MED ORDER — CYCLOPENTOLATE-PHENYLEPHRINE 0.2-1 % OP SOLN
1.0000 [drp] | OPHTHALMIC | Status: AC
  Administered 2015-05-25 (×3): 1 [drp] via OPHTHALMIC

## 2015-05-25 MED ORDER — LIDOCAINE HCL 3.5 % OP GEL
1.0000 "application " | Freq: Once | OPHTHALMIC | Status: AC
  Administered 2015-05-25: 1 via OPHTHALMIC

## 2015-05-25 SURGICAL SUPPLY — 11 items
CLOTH BEACON ORANGE TIMEOUT ST (SAFETY) ×3 IMPLANT
EYE SHIELD UNIVERSAL CLEAR (GAUZE/BANDAGES/DRESSINGS) ×3 IMPLANT
GLOVE BIOGEL PI IND STRL 6.5 (GLOVE) ×1 IMPLANT
GLOVE BIOGEL PI INDICATOR 6.5 (GLOVE) ×2
GLOVE EXAM NITRILE MD LF STRL (GLOVE) ×3 IMPLANT
PAD ARMBOARD 7.5X6 YLW CONV (MISCELLANEOUS) ×3 IMPLANT
SIGHTPATH CAT PROC W REG LENS (Ophthalmic Related) ×3 IMPLANT
SYRINGE LUER LOK 1CC (MISCELLANEOUS) ×3 IMPLANT
TAPE SURG TRANSPORE 1 IN (GAUZE/BANDAGES/DRESSINGS) ×1 IMPLANT
TAPE SURGICAL TRANSPORE 1 IN (GAUZE/BANDAGES/DRESSINGS) ×2
WATER STERILE IRR 250ML POUR (IV SOLUTION) ×3 IMPLANT

## 2015-05-25 NOTE — H&P (Signed)
I have reviewed the H&P, the patient was re-examined, and I have identified no interval changes in medical condition and plan of care since the history and physical of record  

## 2015-05-25 NOTE — Op Note (Signed)
Date of Admission: 05/25/2015  Date of Surgery: 05/25/2015  Pre-Op Dx: Cataract  Right  Eye  Post-Op Dx: Senile Mature Cataract Right Eye,  Dx Code H25.21  Surgeon: Tonny Branch, M.D.  Assistants: None  Anesthesia: Topical with MAC  Indications: Painless, progressive loss of vision with compromise of daily activities.  Surgery: Cataract Extraction with Intraocular lens Implant Right Eye  Discription: The patient had dilating drops and viscous lidocaine placed into the Right eye in the pre-op holding area. After transfer to the operating room, a time out was performed. The patient was then prepped and draped. Beginning with a 46 degree blade a paracentesis port was made at the surgeon's 2 o'clock position. The anterior chamber was then filled with 1% non-preserved lidocaine. The anterior chamber was then filled with Vision Blue to stain the anterior capsule. The Vision Blue was displaced from the anterior chamber with BSS. This was followed by filling the anterior chamber with Provisc. A 2.6mm keratome blade was used to make a clear corneal incision at the temporal limbus. A bent cystatome needle and capsulorhexis forceps were used to create a continuous tear capsulotomy. Hydrodissection was performed with balanced salt solution on a Fine canula. The lens nucleus was then removed using the phacoemulsification handpiece. Residual cortex was removed with the I&A handpiece. The anterior chamber and capsular bag were refilled with Provisc. A posterior chamber intraocular lens was placed into the capsular bag with it's injector. The implant was positioned with the Kuglan hook. The Provisc was then removed from the anterior chamber and capsular bag with the I&A handpiece. Stromal hydration of the main incision and paracentesis port was performed with BSS on a Fine canula. The wounds were tested for leak which was negative. The patient tolerated the procedure well. There were no operative complications. The  patient was then transferred to the recovery room in stable condition.  Complications: None  Specimen: None  EBL: None  Prosthetic device: [employer] 250, power 20.5D, SN [id number].

## 2015-05-25 NOTE — Anesthesia Preprocedure Evaluation (Signed)
Anesthesia Evaluation  Patient identified by MRN, date of birth, ID band Patient awake    Reviewed: Allergy & Precautions, NPO status , Patient's Chart, lab work & pertinent test results  Airway Mallampati: II  TM Distance: >3 FB     Dental  (+) Teeth Intact   Pulmonary Current Smoker,  breath sounds clear to auscultation        Cardiovascular hypertension, Pt. on medications Rhythm:Regular Rate:Normal     Neuro/Psych CVA, Residual Symptoms    GI/Hepatic negative GI ROS,   Endo/Other    Renal/GU      Musculoskeletal   Abdominal   Peds  Hematology   Anesthesia Other Findings   Reproductive/Obstetrics                             Anesthesia Physical Anesthesia Plan  ASA: III  Anesthesia Plan: MAC   Post-op Pain Management:    Induction: Intravenous  Airway Management Planned: Nasal Cannula  Additional Equipment:   Intra-op Plan:   Post-operative Plan:   Informed Consent: I have reviewed the patients History and Physical, chart, labs and discussed the procedure including the risks, benefits and alternatives for the proposed anesthesia with the patient or authorized representative who has indicated his/her understanding and acceptance.     Plan Discussed with:   Anesthesia Plan Comments:         Anesthesia Quick Evaluation

## 2015-05-25 NOTE — Discharge Instructions (Signed)

## 2015-05-25 NOTE — Transfer of Care (Signed)
Immediate Anesthesia Transfer of Care Note  Patient: Drew Cruz  Procedure(s) Performed: Procedure(s): CATARACT EXTRACTION PHACO AND INTRAOCULAR LENS PLACEMENT RIGHT EYE CDE=16.98 (Right)  Patient Location: Short Stay  Anesthesia Type:MAC  Level of Consciousness: awake, alert , oriented and patient cooperative  Airway & Oxygen Therapy: Patient Spontanous Breathing  Post-op Assessment: Report given to RN, Post -op Vital signs reviewed and stable and Patient moving all extremities  Post vital signs: Reviewed and stable  Last Vitals:  Filed Vitals:   05/25/15 0715  BP: 110/73  Pulse:   Temp:   Resp: 15    Complications: No apparent anesthesia complications

## 2015-05-25 NOTE — Anesthesia Postprocedure Evaluation (Signed)
  Anesthesia Post-op Note  Patient: Drew Cruz  Procedure(s) Performed: Procedure(s): CATARACT EXTRACTION PHACO AND INTRAOCULAR LENS PLACEMENT RIGHT EYE CDE=16.98 (Right)  Patient Location: Short Stay  Anesthesia Type:MAC  Level of Consciousness: awake, alert , oriented and patient cooperative  Airway and Oxygen Therapy: Patient Spontanous Breathing  Post-op Pain: none  Post-op Assessment: Post-op Vital signs reviewed, Patient's Cardiovascular Status Stable, Respiratory Function Stable, Patent Airway, No signs of Nausea or vomiting and Pain level controlled              Post-op Vital Signs: Reviewed and stable  Last Vitals:  Filed Vitals:   05/25/15 0715  BP: 110/73  Pulse:   Temp:   Resp: 15    Complications: No apparent anesthesia complications

## 2015-05-26 ENCOUNTER — Encounter (HOSPITAL_COMMUNITY): Payer: Self-pay | Admitting: Ophthalmology

## 2015-06-19 ENCOUNTER — Encounter (HOSPITAL_COMMUNITY)
Admission: RE | Admit: 2015-06-19 | Discharge: 2015-06-19 | Disposition: A | Discharge status: Home / Self Care | Payer: Medicaid Other | Source: Ambulatory Visit | Attending: Ophthalmology | Admitting: Ophthalmology

## 2015-06-24 MED ORDER — CYCLOPENTOLATE-PHENYLEPHRINE OP SOLN OPTIME - NO CHARGE
OPHTHALMIC | Status: AC
  Filled 2015-06-24: qty 2

## 2015-06-24 MED ORDER — LIDOCAINE HCL (PF) 1 % IJ SOLN
INTRAMUSCULAR | Status: AC
  Filled 2015-06-24: qty 2

## 2015-06-24 MED ORDER — LIDOCAINE HCL 3.5 % OP GEL
OPHTHALMIC | Status: AC
  Filled 2015-06-24: qty 1

## 2015-06-24 MED ORDER — NEOMYCIN-POLYMYXIN-DEXAMETH 3.5-10000-0.1 OP SUSP
OPHTHALMIC | Status: AC
  Filled 2015-06-24: qty 5

## 2015-06-24 MED ORDER — TETRACAINE HCL 0.5 % OP SOLN
OPHTHALMIC | Status: AC
  Filled 2015-06-24: qty 2

## 2015-06-24 MED ORDER — PHENYLEPHRINE HCL 2.5 % OP SOLN
OPHTHALMIC | Status: AC
  Filled 2015-06-24: qty 15

## 2015-06-25 ENCOUNTER — Ambulatory Visit (HOSPITAL_COMMUNITY)
Admission: RE | Admit: 2015-06-25 | Discharge: 2015-06-25 | Disposition: A | Discharge status: Home / Self Care | Payer: Medicaid Other | Source: Ambulatory Visit | Attending: Ophthalmology | Admitting: Ophthalmology

## 2015-06-25 ENCOUNTER — Ambulatory Visit (HOSPITAL_COMMUNITY): Payer: Medicaid Other | Admitting: Anesthesiology

## 2015-06-25 ENCOUNTER — Encounter (HOSPITAL_COMMUNITY): Payer: Self-pay | Admitting: *Deleted

## 2015-06-25 ENCOUNTER — Encounter (HOSPITAL_COMMUNITY)
Admission: RE | Disposition: A | Discharge status: Home / Self Care | Payer: Self-pay | Source: Ambulatory Visit | Attending: Ophthalmology

## 2015-06-25 DIAGNOSIS — E780 Pure hypercholesterolemia: Secondary | ICD-10-CM | POA: Diagnosis not present

## 2015-06-25 DIAGNOSIS — H25812 Combined forms of age-related cataract, left eye: Secondary | ICD-10-CM | POA: Insufficient documentation

## 2015-06-25 DIAGNOSIS — Z79899 Other long term (current) drug therapy: Secondary | ICD-10-CM | POA: Diagnosis not present

## 2015-06-25 DIAGNOSIS — F17200 Nicotine dependence, unspecified, uncomplicated: Secondary | ICD-10-CM | POA: Diagnosis not present

## 2015-06-25 DIAGNOSIS — I10 Essential (primary) hypertension: Secondary | ICD-10-CM | POA: Insufficient documentation

## 2015-06-25 DIAGNOSIS — Z7982 Long term (current) use of aspirin: Secondary | ICD-10-CM | POA: Insufficient documentation

## 2015-06-25 HISTORY — PX: CATARACT EXTRACTION W/PHACO: SHX586

## 2015-06-25 SURGERY — PHACOEMULSIFICATION, CATARACT, WITH IOL INSERTION
Anesthesia: Monitor Anesthesia Care | Site: Eye | Laterality: Left

## 2015-06-25 MED ORDER — MIDAZOLAM HCL 2 MG/2ML IJ SOLN
INTRAMUSCULAR | Status: AC
  Filled 2015-06-25: qty 2

## 2015-06-25 MED ORDER — LIDOCAINE 3.5 % OP GEL OPTIME - NO CHARGE
OPHTHALMIC | Status: DC | PRN
  Administered 2015-06-25: 1 [drp] via OPHTHALMIC

## 2015-06-25 MED ORDER — NEOMYCIN-POLYMYXIN-DEXAMETH 3.5-10000-0.1 OP SUSP
OPHTHALMIC | Status: DC | PRN
  Administered 2015-06-25 (×3): 1 [drp] via OPHTHALMIC

## 2015-06-25 MED ORDER — LIDOCAINE HCL 3.5 % OP GEL
1.0000 | Freq: Once | OPHTHALMIC | Status: AC
Start: 2015-06-25 — End: 2015-06-25
  Administered 2015-06-25: 1 via OPHTHALMIC

## 2015-06-25 MED ORDER — PROVISC 10 MG/ML IO SOLN
INTRAOCULAR | Status: DC | PRN
  Administered 2015-06-25: 0.85 mL via INTRAOCULAR

## 2015-06-25 MED ORDER — TETRACAINE HCL 0.5 % OP SOLN
1.0000 [drp] | OPHTHALMIC | Status: AC
  Administered 2015-06-25 (×3): 1 [drp] via OPHTHALMIC

## 2015-06-25 MED ORDER — EPINEPHRINE HCL 1 MG/ML IJ SOLN
INTRAMUSCULAR | Status: AC
  Filled 2015-06-25: qty 1

## 2015-06-25 MED ORDER — MIDAZOLAM HCL 2 MG/2ML IJ SOLN
1.0000 mg | INTRAMUSCULAR | Status: DC | PRN
  Administered 2015-06-25 (×2): 2 mg via INTRAVENOUS
  Filled 2015-06-25: qty 2

## 2015-06-25 MED ORDER — FENTANYL CITRATE (PF) 100 MCG/2ML IJ SOLN
INTRAMUSCULAR | Status: AC
  Filled 2015-06-25: qty 2

## 2015-06-25 MED ORDER — TRYPAN BLUE 0.06 % OP SOLN
OPHTHALMIC | Status: DC | PRN
  Administered 2015-06-25: 0.5 mL via INTRAOCULAR

## 2015-06-25 MED ORDER — BSS IO SOLN
INTRAOCULAR | Status: DC | PRN
  Administered 2015-06-25: 08:00:00

## 2015-06-25 MED ORDER — FENTANYL CITRATE (PF) 100 MCG/2ML IJ SOLN
25.0000 ug | INTRAMUSCULAR | Status: AC
  Administered 2015-06-25 (×2): 25 ug via INTRAVENOUS

## 2015-06-25 MED ORDER — LIDOCAINE HCL (PF) 1 % IJ SOLN
INTRAMUSCULAR | Status: DC | PRN
  Administered 2015-06-25: .5 mL

## 2015-06-25 MED ORDER — BSS IO SOLN
INTRAOCULAR | Status: DC | PRN
  Administered 2015-06-25: 15 mL

## 2015-06-25 MED ORDER — PHENYLEPHRINE HCL 2.5 % OP SOLN
1.0000 [drp] | OPHTHALMIC | Status: AC
  Administered 2015-06-25 (×3): 1 [drp] via OPHTHALMIC

## 2015-06-25 MED ORDER — CYCLOPENTOLATE-PHENYLEPHRINE 0.2-1 % OP SOLN
1.0000 [drp] | OPHTHALMIC | Status: AC
  Administered 2015-06-25 (×3): 1 [drp] via OPHTHALMIC

## 2015-06-25 MED ORDER — LACTATED RINGERS IV SOLN
INTRAVENOUS | Status: DC
  Administered 2015-06-25: 08:00:00 via INTRAVENOUS

## 2015-06-25 MED ORDER — TRYPAN BLUE 0.06 % OP SOLN
OPHTHALMIC | Status: AC
  Filled 2015-06-25: qty 0.5

## 2015-06-25 MED ORDER — POVIDONE-IODINE 5 % OP SOLN
OPHTHALMIC | Status: DC | PRN
  Administered 2015-06-25: 1 via OPHTHALMIC

## 2015-06-25 SURGICAL SUPPLY — 11 items
CLOTH BEACON ORANGE TIMEOUT ST (SAFETY) ×3 IMPLANT
EYE SHIELD UNIVERSAL CLEAR (GAUZE/BANDAGES/DRESSINGS) ×3 IMPLANT
GLOVE BIO SURGEON STRL SZ 6.5 (GLOVE) ×2 IMPLANT
GLOVE BIO SURGEONS STRL SZ 6.5 (GLOVE) ×1
GLOVE ECLIPSE 8.0 STRL XLNG CF (GLOVE) ×3 IMPLANT
PAD ARMBOARD 7.5X6 YLW CONV (MISCELLANEOUS) ×3 IMPLANT
SIGHTPATH CAT PROC W REG LENS (Ophthalmic Related) ×3 IMPLANT
SYRINGE LUER LOK 1CC (MISCELLANEOUS) ×3 IMPLANT
TAPE SURG TRANSPORE 1 IN (GAUZE/BANDAGES/DRESSINGS) ×1 IMPLANT
TAPE SURGICAL TRANSPORE 1 IN (GAUZE/BANDAGES/DRESSINGS) ×2
WATER STERILE IRR 250ML POUR (IV SOLUTION) ×3 IMPLANT

## 2015-06-25 NOTE — Op Note (Signed)
Date of Admission: 06/25/2015  Date of Surgery: 06/25/2015  Pre-Op Dx: Cataract  Left  Eye  Post-Op Dx: Senile Mature Cataract Left Eye,  Dx Code D98.33  Surgeon: Tonny Branch, M.D.  Assistants: None  Anesthesia: Topical with MAC  Indications: Painless, progressive loss of vision with compromise of daily activities.  Surgery: Cataract Extraction with Intraocular lens Implant Left Eye  Discription: The patient had dilating drops and viscous lidocaine placed into the Left eye in the pre-op holding area. After transfer to the operating room, a time out was performed. The patient was then prepped and draped. Beginning with a 20 degree blade a paracentesis port was made at the surgeon's 2 o'clock position. The anterior chamber was then filled with 1% non-preserved lidocaine. The anterior chamber was then filled with Vision Blue to stain the anterior capsule. The Vision Blue was displaced from the anterior chamber with BSS. This was followed by filling the anterior chamber with Viscoat. A 2.11mm keratome blade was used to make a clear corneal incision at the temporal limbus. A bent cystatome needle was used to create a continuous tear capsulotomy. Hydrodissection was performed with balanced salt solution on a Fine canula. The lens nucleus was then removed using the phacoemulsification handpiece. Residual cortex was removed with the I&A handpiece. The capsule was polished but there was residual adherent plaque. The anterior chamber and capsular bag were refilled with Provisc. A posterior chamber intraocular lens was placed into the capsular bag with it's injector. The implant was positioned with the Kuglan hook. The Provisc was then removed from the anterior chamber and capsular bag with the I&A handpiece. Stromal hydration of the main incision and paracentesis port was performed with BSS on a Fine canula. The wounds were tested for leak which was negative. The patient tolerated the procedure well. There were  no operative complications. The patient was then transferred to the recovery room in stable condition.  Complications: None  Specimen: None  EBL: None  Prosthetic device: [employer] iSert, power 20.5D, SN [id number].

## 2015-06-25 NOTE — Transfer of Care (Signed)
Immediate Anesthesia Transfer of Care Note  Patient: Drew Cruz  Procedure(s) Performed: Procedure(s) with comments: CATARACT EXTRACTION PHACO AND INTRAOCULAR LENS PLACEMENT (IOC) (Left) - CDE: 17.56  Patient Location: Short Stay  Anesthesia Type:MAC  Level of Consciousness: awake, alert , oriented and patient cooperative  Airway & Oxygen Therapy: Patient Spontanous Breathing  Post-op Assessment: Report given to RN, Post -op Vital signs reviewed and stable and Patient moving all extremities  Post vital signs: Reviewed and stable  Last Vitals:  Filed Vitals:   06/25/15 0820  BP: 144/92  Pulse:   Temp:   Resp: 23    Complications: No apparent anesthesia complications

## 2015-06-25 NOTE — Progress Notes (Signed)
Dr Geoffry Paradise in to check patient's non operative (Right) eye.  Right eye is red and painful.  No orders given OK for surgery.

## 2015-06-25 NOTE — Discharge Instructions (Signed)

## 2015-06-25 NOTE — H&P (Signed)
I have reviewed the H&P, the patient was re-examined, and I have identified no interval changes in medical condition and plan of care since the history and physical of record  

## 2015-06-25 NOTE — Anesthesia Postprocedure Evaluation (Signed)
  Anesthesia Post-op Note  Patient: Drew Cruz  Procedure(s) Performed: Procedure(s) with comments: CATARACT EXTRACTION PHACO AND INTRAOCULAR LENS PLACEMENT (IOC) (Left) - CDE: 17.56  Patient Location: Short Stay  Anesthesia Type:MAC  Level of Consciousness: awake, alert , oriented and patient cooperative  Airway and Oxygen Therapy: Patient Spontanous Breathing  Post-op Pain: none  Post-op Assessment: Post-op Vital signs reviewed, Patient's Cardiovascular Status Stable, Respiratory Function Stable, Patent Airway, No signs of Nausea or vomiting, Adequate PO intake and Pain level controlled              Post-op Vital Signs: Reviewed and stable  Last Vitals:  Filed Vitals:   06/25/15 0820  BP: 144/92  Pulse:   Temp:   Resp: 23    Complications: No apparent anesthesia complications

## 2015-06-25 NOTE — Anesthesia Preprocedure Evaluation (Signed)
Anesthesia Evaluation  Patient identified by MRN, date of birth, ID band Patient awake    Reviewed: Allergy & Precautions, NPO status , Patient's Chart, lab work & pertinent test results  Airway Mallampati: II  TM Distance: >3 FB     Dental  (+) Teeth Intact   Pulmonary Current Smoker,  breath sounds clear to auscultation        Cardiovascular hypertension, Pt. on medications Rhythm:Regular Rate:Normal     Neuro/Psych CVA, Residual Symptoms    GI/Hepatic negative GI ROS,   Endo/Other    Renal/GU      Musculoskeletal   Abdominal   Peds  Hematology   Anesthesia Other Findings   Reproductive/Obstetrics                             Anesthesia Physical Anesthesia Plan  ASA: III  Anesthesia Plan: MAC   Post-op Pain Management:    Induction: Intravenous  Airway Management Planned: Nasal Cannula  Additional Equipment:   Intra-op Plan:   Post-operative Plan:   Informed Consent: I have reviewed the patients History and Physical, chart, labs and discussed the procedure including the risks, benefits and alternatives for the proposed anesthesia with the patient or authorized representative who has indicated his/her understanding and acceptance.     Plan Discussed with:   Anesthesia Plan Comments:         Anesthesia Quick Evaluation  

## 2015-06-26 ENCOUNTER — Encounter (HOSPITAL_COMMUNITY): Payer: Self-pay | Admitting: Ophthalmology

## 2015-09-22 ENCOUNTER — Telehealth: Payer: Self-pay

## 2015-09-22 NOTE — Telephone Encounter (Signed)
Patient received triage letter from DS. Please call [phone]

## 2015-09-29 NOTE — Telephone Encounter (Signed)
I called to triage pt. He was not there so I spoke to his CNA, [employer]. She said she sits with him 2 hours a day, 7 days a week. She said she is not his POA, but she does everything for him. Pt does not have a POA.  She went with him to his cataract surgery and signed for him.  He does have a sister and mother. She will have him at home about 1:30 PM tomorrow so I can try to talk to him.

## 2015-09-30 ENCOUNTER — Telehealth: Payer: Self-pay

## 2015-09-30 NOTE — Telephone Encounter (Signed)
See note of 09/22/2015.

## 2015-09-30 NOTE — Telephone Encounter (Signed)
I spoke to the pt today. He gave me triage info with no problems. He drinks a lot of alcohol daily he said, he could not tell me how much. ( see also noted in chart). He is scheduled for OV with Walden Field , NP on 10/20/2015 at 9:30 Am.  I told him if he wants the aid to come with him it is fine. He said he is able to sign for himself now, he recently had cataract surgery and his eyesight has improved.

## 2015-10-20 ENCOUNTER — Ambulatory Visit: Payer: Medicaid Other | Admitting: Nurse Practitioner

## 2015-10-29 ENCOUNTER — Ambulatory Visit (INDEPENDENT_AMBULATORY_CARE_PROVIDER_SITE_OTHER): Payer: Medicaid Other | Admitting: Nurse Practitioner

## 2015-10-29 ENCOUNTER — Other Ambulatory Visit: Payer: Self-pay

## 2015-10-29 ENCOUNTER — Encounter: Payer: Self-pay | Admitting: Nurse Practitioner

## 2015-10-29 VITALS — BP 149/85 | HR 74 | Temp 97.4°F | Ht 71.0 in | Wt 220.8 lb

## 2015-10-29 DIAGNOSIS — Z1211 Encounter for screening for malignant neoplasm of colon: Secondary | ICD-10-CM

## 2015-10-29 DIAGNOSIS — F1010 Alcohol abuse, uncomplicated: Secondary | ICD-10-CM

## 2015-10-29 MED ORDER — NA SULFATE-K SULFATE-MG SULF 17.5-3.13-1.6 GM/177ML PO SOLN: 1.0000 | Freq: Once | ORAL | Status: DC

## 2015-10-29 NOTE — Progress Notes (Signed)
Primary Care Physician:  Carlynn Spry, NP Primary Gastroenterologist:  Dr. Oneida Alar  Chief Complaint  Patient presents with  . Colonoscopy    HPI:   60 year old male presents on referral from PCP for colonoscopy.Marland Kitchen PCP notes reviewed, last saw on 09/10/2015. Per PCP notes patient is never had a colonoscopy and is now approximately 15 years overdue. Attempted triage on 09/30/2015 with no current symptoms, however he stated he drinks quite a lot. He was referred for office visit for possible need of monitored anesthesia care for his procedure.  Today he states he's doing well. Has never had a colonoscopy before. Denies abdominal pain, N/V, diarrhea, change in bowel habits, hematochezia, melena, unintentional weight loss, fever, chills. Denies chest pain, dyspnea, dizziness, lightheadedness, syncope, near syncope. Denies any other upper or lower GI symptoms.  Past Medical History  Diagnosis Date  . Hypertension   . Hypercholesteremia   . Cataracts, bilateral     Past Surgical History  Procedure Laterality Date  . Cataract extraction w/phaco Right 05/25/2015    Procedure: CATARACT EXTRACTION PHACO AND INTRAOCULAR LENS PLACEMENT RIGHT EYE CDE=16.98;  Surgeon: Tonny Branch, MD;  Location: [facility];  Service: Ophthalmology;  Laterality: Right;  . Cataract extraction w/phaco Left 06/25/2015    Procedure: CATARACT EXTRACTION PHACO AND INTRAOCULAR LENS PLACEMENT (IOC);  Surgeon: Tonny Branch, MD;  Location: [facility];  Service: Ophthalmology;  Laterality: Left;  CDE: 17.56    Current Outpatient Prescriptions  Medication Sig Dispense Refill  . aspirin 325 MG tablet Take 1 tablet (325 mg total) by mouth daily. 100 tablet 3  . atorvastatin (LIPITOR) 20 MG tablet Take 20 mg by mouth daily.  11  . lisinopril (PRINIVIL,ZESTRIL) 10 MG tablet Take 10 mg by mouth daily.  11  . loratadine (CLARITIN) 10 MG tablet Take 10 mg by mouth daily.     No current facility-administered medications for this visit.     Allergies as of 10/29/2015  . (No Known Allergies)    No family history on file.  Social History   Social History  . Marital Status: Single    Spouse Name: N/A  . Number of Children: N/A  . Years of Education: N/A   Occupational History  . Not on file.   Social History Main Topics  . Smoking status: Current Every Day Smoker -- 0.50 packs/day for 48 years    Types: Cigarettes  . Smokeless tobacco: Not on file  . Alcohol Use: 14.4 oz/week    24 Cans of beer per week  . Drug Use: No  . Sexual Activity: Not on file   Other Topics Concern  . Not on file   Social History Narrative    Review of Systems: 10-point ROS negative except as per HPI.    Physical Exam: BP 149/85 mmHg  Pulse 74  Temp(Src) 97.4 F (36.3 C) (Oral)  Ht 5\' 11"  (1.803 m)  Wt 220 lb 12.8 oz (100.154 kg)  BMI 30.81 kg/m2 General:   Alert and oriented. Pleasant and cooperative. Well-nourished and well-developed.  Head:  Normocephalic and atraumatic. Eyes:  Without icterus, sclera clear and conjunctiva pink.  Ears:  Normal auditory acuity. Cardiovascular:  S1, S2 present without murmurs appreciated. Extremities without clubbing or edema. Respiratory:  Clear to auscultation bilaterally. No wheezes, rales, or rhonchi. No distress.  Gastrointestinal:  +BS, soft, non-tender and non-distended. No HSM noted. No guarding or rebound. No masses appreciated.  Rectal:  Deferred  Musculoskalatal:  Symmetrical without gross deformities. Neurologic:  Alert  and oriented x4;  grossly normal neurologically. Psych:  Alert and cooperative. Normal mood and affect. Heme/Lymph/Immune: No significant cervical adenopathy. No excessive bruising noted.    10/29/2015 10:10 AM

## 2015-10-29 NOTE — Progress Notes (Signed)
cc'ed to pcp °

## 2015-10-29 NOTE — Patient Instructions (Signed)
1. We'll schedule your procedure for you. 2. Return for follow-up as needed or as recommended in post procedure.

## 2015-10-29 NOTE — Assessment & Plan Note (Signed)
60 year old male presents for first-ever screening colonoscopy. Generally asymptomatic from a GI standpoint. Admitted to the triage nurse he "drinks a lot." At his visit today he admitted to approximately 3 beers a night every day. We'll proceed with colonoscopy and the OR on propofol/MAC.  Proceed with colonoscopy with Dr. Oneida Alar in the near future. The risks, benefits, and alternatives have been discussed in detail with the patient. They state understanding and desire to proceed.   Asian is not on any anticoagulants, anxiolytics, chronic pain medications, or antidepressants. Potential alcohol abuse with 3 beers a day, every day admitted an office visit. Per triage nurse states "he drinks a lot." We'll plan for the procedure and the OR on propofol/MAC to promote adequate sedation for potential alcohol abuse.

## 2015-10-29 NOTE — Assessment & Plan Note (Signed)
Also triage nurse she drinks a lot. Admits to 3 beers a day every day during his office visit. Potential alcohol abuse which could complicate conscious sedation. Due to this we'll move forward with a colonoscopy on propofol/MAC as described above.

## 2015-11-18 NOTE — Patient Instructions (Signed)
Drew Cruz  11/18/2015     @PREFPERIOPPHARMACY @   Your procedure is scheduled on  11/24/2015  Report to [facility] at  645  A.M.  Call this number if you have problems the morning of surgery:  [phone]   Remember:  Do not eat food or drink liquids after midnight.  Take these medicines the morning of surgery with A SIP OF WATER  Lisinopril, clartin   Do not wear jewelry, make-up or nail polish.  Do not wear lotions, powders, or perfumes.  You may wear deodorant.  Do not shave 48 hours prior to surgery.  Men may shave face and neck.  Do not bring valuables to the hospital.  [facility] is not responsible for any belongings or valuables.  Contacts, dentures or bridgework may not be worn into surgery.  Leave your suitcase in the car.  After surgery it may be brought to your room.  For patients admitted to the hospital, discharge time will be determined by your treatment team.  Patients discharged the day of surgery will not be allowed to drive home.   Name and phone number of your driver:   family Special instructions:  Follow the diet and prep instructions given to you by Dr Nona Dell office.  Please read over the following fact sheets that you were given. Coughing and Deep Breathing, Surgical Site Infection Prevention, Anesthesia Post-op Instructions and Care and Recovery After Surgery      Colonoscopy A colonoscopy is an exam to look at the entire large intestine (colon). This exam can help find problems such as tumors, polyps, inflammation, and areas of bleeding. The exam takes about 1 hour.  LET [facility] CARE PROVIDER KNOW ABOUT:   Any allergies you have.  All medicines you are taking, including vitamins, herbs, eye drops, creams, and over-the-counter medicines.  Previous problems you or members of your family have had with the use of anesthetics.  Any blood disorders you have.  Previous surgeries you have had.  Medical conditions you have. RISKS  AND COMPLICATIONS  Generally, this is a safe procedure. However, as with any procedure, complications can occur. Possible complications include:  Bleeding.  Tearing or rupture of the colon wall.  Reaction to medicines given during the exam.  Infection (rare). BEFORE THE PROCEDURE   Ask your health care provider about changing or stopping your regular medicines.  You may be prescribed an oral bowel prep. This involves drinking a large amount of medicated liquid, starting the day before your procedure. The liquid will cause you to have multiple loose stools until your stool is almost clear or light green. This cleans out your colon in preparation for the procedure.  Do not eat or drink anything else once you have started the bowel prep, unless your health care provider tells you it is safe to do so.  Arrange for someone to drive you home after the procedure. PROCEDURE   You will be given medicine to help you relax (sedative).  You will lie on your side with your knees bent.  A long, flexible tube with a light and camera on the end (colonoscope) will be inserted through the rectum and into the colon. The camera sends video back to a computer screen as it moves through the colon. The colonoscope also releases carbon dioxide gas to inflate the colon. This helps your health care provider see the area better.  During the exam, your health care provider may take  a small tissue sample (biopsy) to be examined under a microscope if any abnormalities are found.  The exam is finished when the entire colon has been viewed. AFTER THE PROCEDURE   Do not drive for 24 hours after the exam.  You may have a small amount of blood in your stool.  You may pass moderate amounts of gas and have mild abdominal cramping or bloating. This is caused by the gas used to inflate your colon during the exam.  Ask when your test results will be ready and how you will get your results. Make sure you get your test  results.   This information is not intended to replace advice given to you by your health care provider. Make sure you discuss any questions you have with your health care provider.   Document Released: 09/16/2000 Document Revised: 07/10/2013 Document Reviewed: 05/27/2013 Elsevier Interactive Patient Education 2016 Elsevier Inc. Colonoscopy, Care After Refer to this sheet in the next few weeks. These instructions provide you with information on caring for yourself after your procedure. Your health care provider may also give you more specific instructions. Your treatment has been planned according to current medical practices, but problems sometimes occur. Call your health care provider if you have any problems or questions after your procedure. WHAT TO EXPECT AFTER THE PROCEDURE  After your procedure, it is typical to have the following:  A small amount of blood in your stool.  Moderate amounts of gas and mild abdominal cramping or bloating. HOME CARE INSTRUCTIONS  Do not drive, operate machinery, or sign important documents for 24 hours.  You may shower and resume your regular physical activities, but move at a slower pace for the first 24 hours.  Take frequent rest periods for the first 24 hours.  Walk around or put a warm pack on your abdomen to help reduce abdominal cramping and bloating.  Drink enough fluids to keep your urine clear or pale yellow.  You may resume your normal diet as instructed by your health care provider. Avoid heavy or fried foods that are hard to digest.  Avoid drinking alcohol for 24 hours or as instructed by your health care provider.  Only take over-the-counter or prescription medicines as directed by your health care provider.  If a tissue sample (biopsy) was taken during your procedure:  Do not take aspirin or blood thinners for 7 days, or as instructed by your health care provider.  Do not drink alcohol for 7 days, or as instructed by your health  care provider.  Eat soft foods for the first 24 hours. SEEK MEDICAL CARE IF: You have persistent spotting of blood in your stool 2-3 days after the procedure. SEEK IMMEDIATE MEDICAL CARE IF:  You have more than a small spotting of blood in your stool.  You pass large blood clots in your stool.  Your abdomen is swollen (distended).  You have nausea or vomiting.  You have a fever.  You have increasing abdominal pain that is not relieved with medicine.   This information is not intended to replace advice given to you by your health care provider. Make sure you discuss any questions you have with your health care provider.   Document Released: 05/03/2004 Document Revised: 07/10/2013 Document Reviewed: 05/27/2013 Elsevier Interactive Patient Education 2016 Elsevier Inc. PATIENT INSTRUCTIONS POST-ANESTHESIA  IMMEDIATELY FOLLOWING SURGERY:  Do not drive or operate machinery for the first twenty four hours after surgery.  Do not make any important decisions for twenty four hours  after surgery or while taking narcotic pain medications or sedatives.  If you develop intractable nausea and vomiting or a severe headache please notify your doctor immediately.  FOLLOW-UP:  Please make an appointment with your surgeon as instructed. You do not need to follow up with anesthesia unless specifically instructed to do so.  WOUND CARE INSTRUCTIONS (if applicable):  Keep a dry clean dressing on the anesthesia/puncture wound site if there is drainage.  Once the wound has quit draining you may leave it open to air.  Generally you should leave the bandage intact for twenty four hours unless there is drainage.  If the epidural site drains for more than 36-48 hours please call the anesthesia department.  QUESTIONS?:  Please feel free to call your physician or the hospital operator if you have any questions, and they will be happy to assist you.

## 2015-11-19 ENCOUNTER — Other Ambulatory Visit (HOSPITAL_COMMUNITY): Payer: Self-pay | Admitting: Internal Medicine

## 2015-11-19 ENCOUNTER — Encounter (HOSPITAL_COMMUNITY): Payer: Self-pay

## 2015-11-19 ENCOUNTER — Encounter (HOSPITAL_COMMUNITY)
Admission: RE | Admit: 2015-11-19 | Discharge: 2015-11-19 | Disposition: A | Discharge status: Home / Self Care | Payer: Medicaid Other | Source: Ambulatory Visit | Attending: Gastroenterology | Admitting: Gastroenterology

## 2015-11-19 ENCOUNTER — Ambulatory Visit (HOSPITAL_COMMUNITY)
Admission: RE | Admit: 2015-11-19 | Discharge: 2015-11-19 | Disposition: A | Discharge status: Home / Self Care | Payer: Medicaid Other | Source: Ambulatory Visit | Attending: Internal Medicine | Admitting: Internal Medicine

## 2015-11-19 DIAGNOSIS — M25511 Pain in right shoulder: Secondary | ICD-10-CM | POA: Insufficient documentation

## 2015-11-19 HISTORY — DX: Cerebral infarction, unspecified: I63.9

## 2015-11-19 LAB — CBC WITH DIFFERENTIAL/PLATELET
BASOS PCT: 0 %
Basophils Absolute: 0 10*3/uL (ref 0.0–0.1)
EOS ABS: 0 10*3/uL (ref 0.0–0.7)
EOS PCT: 1 %
HCT: 38.6 % — ABNORMAL LOW (ref 39.0–52.0)
Hemoglobin: 13 g/dL (ref 13.0–17.0)
Lymphocytes Relative: 39 %
Lymphs Abs: 1.7 10*3/uL (ref 0.7–4.0)
MCH: 28 pg (ref 26.0–34.0)
MCHC: 33.7 g/dL (ref 30.0–36.0)
MCV: 83.2 fL (ref 78.0–100.0)
MONO ABS: 0.5 10*3/uL (ref 0.1–1.0)
MONOS PCT: 11 %
Neutro Abs: 2.1 10*3/uL (ref 1.7–7.7)
Neutrophils Relative %: 49 %
Platelets: 281 10*3/uL (ref 150–400)
RBC: 4.64 MIL/uL (ref 4.22–5.81)
RDW: 13.9 % (ref 11.5–15.5)
WBC: 4.3 10*3/uL (ref 4.0–10.5)

## 2015-11-19 LAB — BASIC METABOLIC PANEL
Anion gap: 8 (ref 5–15)
BUN: 16 mg/dL (ref 6–20)
CALCIUM: 9.3 mg/dL (ref 8.9–10.3)
CO2: 24 mmol/L (ref 22–32)
CREATININE: 1.04 mg/dL (ref 0.61–1.24)
Chloride: 105 mmol/L (ref 101–111)
GFR calc non Af Amer: >60 mL/min (ref >60)
Glucose, Bld: 105 mg/dL — ABNORMAL HIGH (ref 65–99)
Potassium: 4.6 mmol/L (ref 3.5–5.1)
SODIUM: 137 mmol/L (ref 135–145)

## 2015-11-24 ENCOUNTER — Ambulatory Visit (HOSPITAL_COMMUNITY): Payer: Medicaid Other | Admitting: Anesthesiology

## 2015-11-24 ENCOUNTER — Ambulatory Visit (HOSPITAL_COMMUNITY)
Admission: RE | Admit: 2015-11-24 | Discharge: 2015-11-24 | Disposition: A | Discharge status: Home / Self Care | Payer: Medicaid Other | Source: Ambulatory Visit | Attending: Gastroenterology | Admitting: Gastroenterology

## 2015-11-24 ENCOUNTER — Encounter (HOSPITAL_COMMUNITY)
Admission: RE | Disposition: A | Discharge status: Home / Self Care | Payer: Self-pay | Source: Ambulatory Visit | Attending: Gastroenterology

## 2015-11-24 ENCOUNTER — Encounter (HOSPITAL_COMMUNITY): Payer: Self-pay | Admitting: *Deleted

## 2015-11-24 DIAGNOSIS — D128 Benign neoplasm of rectum: Secondary | ICD-10-CM | POA: Insufficient documentation

## 2015-11-24 DIAGNOSIS — K621 Rectal polyp: Secondary | ICD-10-CM

## 2015-11-24 DIAGNOSIS — Z7982 Long term (current) use of aspirin: Secondary | ICD-10-CM | POA: Insufficient documentation

## 2015-11-24 DIAGNOSIS — K648 Other hemorrhoids: Secondary | ICD-10-CM | POA: Insufficient documentation

## 2015-11-24 DIAGNOSIS — Z79899 Other long term (current) drug therapy: Secondary | ICD-10-CM | POA: Insufficient documentation

## 2015-11-24 DIAGNOSIS — Z1211 Encounter for screening for malignant neoplasm of colon: Secondary | ICD-10-CM | POA: Diagnosis present

## 2015-11-24 DIAGNOSIS — I69392 Facial weakness following cerebral infarction: Secondary | ICD-10-CM | POA: Diagnosis not present

## 2015-11-24 DIAGNOSIS — I10 Essential (primary) hypertension: Secondary | ICD-10-CM | POA: Insufficient documentation

## 2015-11-24 DIAGNOSIS — E7800 Pure hypercholesterolemia, unspecified: Secondary | ICD-10-CM | POA: Insufficient documentation

## 2015-11-24 HISTORY — PX: POLYPECTOMY: SHX5525

## 2015-11-24 HISTORY — PX: COLONOSCOPY WITH PROPOFOL: SHX5780

## 2015-11-24 SURGERY — COLONOSCOPY WITH PROPOFOL
Anesthesia: Monitor Anesthesia Care

## 2015-11-24 MED ORDER — MIDAZOLAM HCL 2 MG/2ML IJ SOLN
INTRAMUSCULAR | Status: AC
  Filled 2015-11-24: qty 2

## 2015-11-24 MED ORDER — MIDAZOLAM HCL 2 MG/2ML IJ SOLN
1.0000 mg | INTRAMUSCULAR | Status: DC | PRN
  Administered 2015-11-24: 2 mg via INTRAVENOUS

## 2015-11-24 MED ORDER — PROPOFOL 500 MG/50ML IV EMUL
INTRAVENOUS | Status: DC | PRN
  Administered 2015-11-24: 09:00:00 via INTRAVENOUS
  Administered 2015-11-24: 100 ug/kg/min via INTRAVENOUS

## 2015-11-24 MED ORDER — FENTANYL CITRATE (PF) 100 MCG/2ML IJ SOLN
INTRAMUSCULAR | Status: AC
  Filled 2015-11-24: qty 2

## 2015-11-24 MED ORDER — ONDANSETRON HCL 4 MG/2ML IJ SOLN: 4.0000 mg | Freq: Once | INTRAMUSCULAR | Status: DC | PRN

## 2015-11-24 MED ORDER — FENTANYL CITRATE (PF) 100 MCG/2ML IJ SOLN
25.0000 ug | INTRAMUSCULAR | Status: AC
  Administered 2015-11-24 (×2): 25 ug via INTRAVENOUS

## 2015-11-24 MED ORDER — LACTATED RINGERS IV SOLN
INTRAVENOUS | Status: DC
  Administered 2015-11-24: 08:00:00 via INTRAVENOUS

## 2015-11-24 MED ORDER — FENTANYL CITRATE (PF) 100 MCG/2ML IJ SOLN: 25.0000 ug | INTRAMUSCULAR | Status: DC | PRN

## 2015-11-24 MED ORDER — MIDAZOLAM HCL 5 MG/5ML IJ SOLN
INTRAMUSCULAR | Status: DC | PRN
  Administered 2015-11-24: 2 mg via INTRAVENOUS

## 2015-11-24 MED ORDER — PROPOFOL 10 MG/ML IV BOLUS
INTRAVENOUS | Status: AC
  Filled 2015-11-24: qty 40

## 2015-11-24 NOTE — H&P (Signed)
  Primary Care Physician:  Carlynn Spry, NP Primary Gastroenterologist:  Dr. Oneida Alar  Pre-Procedure History & Physical: HPI:  Drew Cruz is a 60 y.o. male here for [place].  Past Medical History  Diagnosis Date  . Hypertension   . Hypercholesteremia   . Cataracts, bilateral   . Stroke ([place])     04/2015. left sided facial weekness.    Past Surgical History  Procedure Laterality Date  . Cataract extraction w/phaco Right 05/25/2015    Procedure: CATARACT EXTRACTION PHACO AND INTRAOCULAR LENS PLACEMENT RIGHT EYE CDE=16.98;  Surgeon: Tonny Branch, MD;  Location: [facility];  Service: Ophthalmology;  Laterality: Right;  . Cataract extraction w/phaco Left 06/25/2015    Procedure: CATARACT EXTRACTION PHACO AND INTRAOCULAR LENS PLACEMENT (IOC);  Surgeon: Tonny Branch, MD;  Location: [facility];  Service: Ophthalmology;  Laterality: Left;  CDE: 17.56    Prior to Admission medications   Medication Sig Start Date End Date Taking? Authorizing Provider  aspirin 325 MG tablet Take 1 tablet (325 mg total) by mouth daily. 04/10/15  Yes Orvan Falconer, MD  atorvastatin (LIPITOR) 20 MG tablet Take 20 mg by mouth daily. 05/04/15  Yes Historical Provider, MD  lisinopril (PRINIVIL,ZESTRIL) 10 MG tablet Take 10 mg by mouth daily. 03/04/15  Yes Historical Provider, MD  loratadine (CLARITIN) 10 MG tablet Take 10 mg by mouth daily.   Yes Historical Provider, MD  Na Sulfate-K Sulfate-Mg Sulf SOLN Take 1 kit by mouth once. 10/29/15 11/28/15 Yes Carlis Stable, NP    Allergies as of 10/29/2015  . (No Known Allergies)    Family History  Problem Relation Age of Onset  . Colon cancer Neg Hx     Social History   Social History  . Marital Status: Single    Spouse Name: N/A  . Number of Children: N/A  . Years of Education: N/A   Occupational History  . Not on file.   Social History Main Topics  . Smoking status: Current Every Day Smoker -- 0.50 packs/day for 48 years    Types: Cigarettes  . Smokeless tobacco: Never  Used  . Alcohol Use: 14.4 oz/week    24 Cans of beer per week     Comment: Drinks 2-3 beers a day for "a long time."  . Drug Use: No  . Sexual Activity: No   Other Topics Concern  . Not on file   Social History Narrative    Review of Systems: See HPI, otherwise negative ROS   Physical Exam: Temp(Src) 97.6 F (36.4 C) (Oral) General:   Alert,  pleasant and cooperative in NAD Head:  Normocephalic and atraumatic. Neck:  Supple; Lungs:  Clear throughout to auscultation.    Heart:  Regular rate and rhythm. Abdomen:  Soft, nontender and nondistended. Normal bowel sounds, without guarding, and without rebound.   Neurologic:  Alert and  oriented x4;  grossly normal neurologically.  Impression/Plan:     SCREENING  Plan:  1. TCS TODAY.

## 2015-11-24 NOTE — Anesthesia Postprocedure Evaluation (Signed)
Anesthesia Post Note  Patient: Drew Cruz  Procedure(s) Performed: Procedure(s) (LRB): COLONOSCOPY WITH PROPOFOL (N/A) POLYPECTOMY  Patient location during evaluation: PACU Anesthesia Type: MAC Level of consciousness: awake and alert and patient cooperative Pain management: pain level controlled Vital Signs Assessment: post-procedure vital signs reviewed and stable Respiratory status: respiratory function stable Cardiovascular status: blood pressure returned to baseline Postop Assessment: no signs of nausea or vomiting Anesthetic complications: no    Last Vitals:  Filed Vitals:   11/24/15 0815 11/24/15 0820  BP: 128/87   Temp:    Resp: 13 19    Last Pain:  Filed Vitals:   11/24/15 0909  PainSc: 4                  Tamon Parkerson J

## 2015-11-24 NOTE — Anesthesia Preprocedure Evaluation (Signed)
Anesthesia Evaluation  Patient identified by MRN, date of birth, ID band Patient awake    Reviewed: Allergy & Precautions, NPO status , Patient's Chart, lab work & pertinent test results  Airway Mallampati: II  TM Distance: >3 FB     Dental  (+) Teeth Intact   Pulmonary Current Smoker,    breath sounds clear to auscultation       Cardiovascular hypertension, Pt. on medications  Rhythm:Regular Rate:Normal     Neuro/Psych CVA, Residual Symptoms    GI/Hepatic negative GI ROS,   Endo/Other    Renal/GU      Musculoskeletal   Abdominal   Peds  Hematology   Anesthesia Other Findings   Reproductive/Obstetrics                             Anesthesia Physical Anesthesia Plan  ASA: III  Anesthesia Plan: MAC   Post-op Pain Management:    Induction: Intravenous  Airway Management Planned: Simple Face Mask  Additional Equipment:   Intra-op Plan:   Post-operative Plan:   Informed Consent: I have reviewed the patients History and Physical, chart, labs and discussed the procedure including the risks, benefits and alternatives for the proposed anesthesia with the patient or authorized representative who has indicated his/her understanding and acceptance.     Plan Discussed with:   Anesthesia Plan Comments:         Anesthesia Quick Evaluation

## 2015-11-24 NOTE — Op Note (Signed)
[facility] [address] [place], [address]   COLONOSCOPY PROCEDURE REPORT  PATIENT: Drew Cruz, Drew Cruz  MR#: [id number] BIRTHDATE: Oct 25, 1955 , 53  yrs. old GENDER: male ENDOSCOPIST: Danie Binder, MD REFERRED BY:   Carlynn Spry, MD PROCEDURE DATE:  Dec 02, 2015 PROCEDURE:   Colonoscopy with cold biopsy polypectomy INDICATIONS:average risk patient for colon cancer. MEDICATIONS: Monitored anesthesia care  DESCRIPTION OF PROCEDURE:    Physical exam was performed.  Informed consent was obtained from the patient after explaining the benefits, risks, and alternatives to procedure.  The patient was connected to monitor and placed in left lateral position. Continuous oxygen was provided by nasal cannula and IV medicine administered through an indwelling cannula.  After administration of sedation and rectal exam, the patients rectum was intubated and the [id number]  colonoscope was advanced under direct visualization to the cecum.  The scope was removed slowly by carefully examining the color, texture, anatomy, and integrity mucosa on the way out.  The patient was recovered in endoscopy and discharged home in satisfactory condition. Estimated blood loss is zero unless otherwise noted in this procedure report.    COLON FINDINGS: A sessile polyp measuring 3 mm in size was found in the rectum.  A polypectomy was performed with cold forceps.  , The examination was otherwise normal.  , POOR PREP IN RIGHT COLON, and Small internal hemorrhoids were found.  PREP QUALITY: good in left colon with irrigation.  CECAL W/D TIME: 14       minutes   COMPLICATIONS: None  ENDOSCOPIC IMPRESSION: 1.   ONE RECTAL polyp removed 2.   The examination was otherwise normal 3.   POOR PREP IN RIGHT COLON 4.   Small internal hemorrhoids  RECOMMENDATIONS: FOLLOW A HIGH FIBER DIET. AWAIT BIOPSY RESULTS. NEXT COLONOSCOPY TO BE DETERMINED AFTER THE PATHOLOGY REPORT  IS FINAL.      _______________________________ eSignedDanie Binder, MD 12-02-15 4:01 PM    CPT CODES: ICD CODES:  The ICD and CPT codes recommended by this software are interpretations from the data that the clinical staff has captured with the software.  The verification of the translation of this report to the ICD and CPT codes and modifiers is the sole responsibility of the health care institution and practicing physician where this report was generated.  [employer]. will not be held responsible for the validity of the ICD and CPT codes included on this report.  AMA assumes no liability for data contained or not contained herein. CPT is a Designer, television/film set of the [employer].

## 2015-11-24 NOTE — Discharge Instructions (Signed)
You had 1 polyp removed FROM YOUR RECTUM. You have internal hemorrhoids. Your prep was not good IN YOUR RIGHT COLON. YOU MAY NEED TO COME BACK.   FOLLOW A HIGH FIBER DIET. AVOID ITEMS THAT CAUSE BLOATING & GAS. SEE INFO BELOW.  YOUR BIOPSY RESULTS WILL BE AVAILABLE IN MY CHART FEB 24 AND MY OFFICE WILL CONTACT YOU IN 10-14 DAYS WITH YOUR RESULTS.   I WILL LET YOU KNOW WHEN YOUR NEXT COLONOSCOPY NEEDS TO BE AFTER THE PATHOLOGY REPORT IS FINAL.   Colonoscopy Care After Read the instructions outlined below and refer to this sheet in the next week. These discharge instructions provide you with general information on caring for yourself after you leave the hospital. While your treatment has been planned according to the most current medical practices available, unavoidable complications occasionally occur. If you have any problems or questions after discharge, call DR. Jerral Mccauley, [phone].  ACTIVITY  You may resume your regular activity, but move at a slower pace for the next 24 hours.   Take frequent rest periods for the next 24 hours.   Walking will help get rid of the air and reduce the bloated feeling in your belly (abdomen).   No driving for 24 hours (because of the medicine (anesthesia) used during the test).   You may shower.   Do not sign any important legal documents or operate any machinery for 24 hours (because of the anesthesia used during the test).    NUTRITION  Drink plenty of fluids.   You may resume your normal diet as instructed by your doctor.   Begin with a light meal and progress to your normal diet. Heavy or fried foods are harder to digest and may make you feel sick to your stomach (nauseated).   Avoid alcoholic beverages for 24 hours or as instructed.    MEDICATIONS  You may resume your normal medications.   WHAT YOU CAN EXPECT TODAY  Some feelings of bloating in the abdomen.   Passage of more gas than usual.   Spotting of blood in your stool or  on the toilet paper  .  IF YOU HAD POLYPS REMOVED DURING THE COLONOSCOPY:  Eat a soft diet IF YOU HAVE NAUSEA, BLOATING, ABDOMINAL PAIN, OR VOMITING.    FINDING OUT THE RESULTS OF YOUR TEST Not all test results are available during your visit. DR. Oneida Alar WILL CALL YOU WITHIN 14 DAYS OF YOUR PROCEDUE WITH YOUR RESULTS. Do not assume everything is normal if you have not heard from DR. Roma Schanz HER OFFICE AT [phone].  SEEK IMMEDIATE MEDICAL ATTENTION AND CALL THE OFFICE: [phone] IF:  You have more than a spotting of blood in your stool.   Your belly is swollen (abdominal distention).   You are nauseated or vomiting.   You have a temperature over 101F.   You have abdominal pain or discomfort that is severe or gets worse throughout the day.   High-Fiber Diet A high-fiber diet changes your normal diet to include more whole grains, legumes, fruits, and vegetables. Changes in the diet involve replacing refined carbohydrates with unrefined foods. The calorie level of the diet is essentially unchanged. The Dietary Reference Intake (recommended amount) for adult males is 38 grams per day. For adult females, it is 25 grams per day. Pregnant and lactating women should consume 28 grams of fiber per day. Fiber is the intact part of a plant that is not broken down during digestion. Functional fiber is fiber that has been isolated  from the plant to provide a beneficial effect in the body. PURPOSE  Increase stool bulk.   Ease and regulate bowel movements.   Lower cholesterol.  REDUCE RISK OF COLON CANCER  INDICATIONS THAT YOU NEED MORE FIBER  Constipation and hemorrhoids.   Uncomplicated diverticulosis (intestine condition) and irritable bowel syndrome.   Weight management.   As a protective measure against hardening of the arteries (atherosclerosis), diabetes, and cancer.   GUIDELINES FOR INCREASING FIBER IN THE DIET  Start adding fiber to the diet slowly. A gradual  increase of about 5 more grams (2 slices of whole-wheat bread, 2 servings of most fruits or vegetables, or 1 bowl of high-fiber cereal) per day is best. Too rapid an increase in fiber may result in constipation, flatulence, and bloating.   Drink enough water and fluids to keep your urine clear or pale yellow. Water, juice, or caffeine-free drinks are recommended. Not drinking enough fluid may cause constipation.   Eat a variety of high-fiber foods rather than one type of fiber.   Try to increase your intake of fiber through using high-fiber foods rather than fiber pills or supplements that contain small amounts of fiber.   The goal is to change the types of food eaten. Do not supplement your present diet with high-fiber foods, but replace foods in your present diet.   INCLUDE A VARIETY OF FIBER SOURCES  Replace refined and processed grains with whole grains, canned fruits with fresh fruits, and incorporate other fiber sources. White rice, white breads, and most bakery goods contain little or no fiber.   Brown whole-grain rice, buckwheat oats, and many fruits and vegetables are all good sources of fiber. These include: broccoli, Brussels sprouts, cabbage, cauliflower, beets, sweet potatoes, white potatoes (skin on), carrots, tomatoes, eggplant, squash, berries, fresh fruits, and dried fruits.   Cereals appear to be the richest source of fiber. Cereal fiber is found in whole grains and bran. Bran is the fiber-rich outer coat of cereal grain, which is largely removed in refining. In whole-grain cereals, the bran remains. In breakfast cereals, the largest amount of fiber is found in those with "bran" in their names. The fiber content is sometimes indicated on the label.   You may need to include additional fruits and vegetables each day.   In baking, for 1 cup white flour, you may use the following substitutions:   1 cup whole-wheat flour minus 2 tablespoons.   1/2 cup white flour plus 1/2 cup  whole-wheat flour.   Polyps, Colon  A polyp is extra tissue that grows inside your body. Colon polyps grow in the large intestine. The large intestine, also called the colon, is part of your digestive system. It is a long, hollow tube at the end of your digestive tract where your body makes and stores stool. Most polyps are not dangerous. They are benign. This means they are not cancerous. But over time, some types of polyps can turn into cancer. Polyps that are smaller than a pea are usually not harmful. But larger polyps could someday become or may already be cancerous. To be safe, doctors remove all polyps and test them.   PREVENTION There is not one sure way to prevent polyps. You might be able to lower your risk of getting them if you:  Eat more fruits and vegetables and less fatty food.   Do not smoke.   Avoid alcohol.   Exercise every day.   Lose weight if you are overweight.   Eating  more calcium and folate can also lower your risk of getting polyps. Some foods that are rich in calcium are milk, cheese, and broccoli. Some foods that are rich in folate are chickpeas, kidney beans, and spinach.   Hemorrhoids Hemorrhoids are dilated (enlarged) veins around the rectum. Sometimes clots will form in the veins. This makes them swollen and painful. These are called thrombosed hemorrhoids. Causes of hemorrhoids include:  Constipation.   Straining to have a bowel movement.   HEAVY LIFTING  HOME CARE INSTRUCTIONS  Eat a well balanced diet and drink 6 to 8 glasses of water every day to avoid constipation. You may also use a bulk laxative.   Avoid straining to have bowel movements.   Keep anal area dry and clean.   Do not use a donut shaped pillow or sit on the toilet for long periods. This increases blood pooling and pain.   Move your bowels when your body has the urge; this will require less straining and will decrease pain and pressure.

## 2015-11-24 NOTE — Progress Notes (Signed)
REVIEWED-NO ADDITIONAL RECOMMENDATIONS. 

## 2015-11-24 NOTE — Transfer of Care (Signed)
Immediate Anesthesia Transfer of Care Note  Patient: Drew Cruz  Procedure(s) Performed: Procedure(s) with comments: COLONOSCOPY WITH PROPOFOL (N/A) - 0815 POLYPECTOMY - rectal polyp  Patient Location: PACU  Anesthesia Type:MAC  Level of Consciousness: awake and patient cooperative  Airway & Oxygen Therapy: Patient Spontanous Breathing and Patient connected to face mask oxygen  Post-op Assessment: Report given to RN, Post -op Vital signs reviewed and stable and Patient moving all extremities  Post vital signs: Reviewed and stable  Last Vitals:  Filed Vitals:   11/24/15 0815 11/24/15 0820  BP: 128/87   Temp:    Resp: 13 19    Complications: No apparent anesthesia complications

## 2015-11-26 ENCOUNTER — Encounter (HOSPITAL_COMMUNITY): Payer: Self-pay | Admitting: Gastroenterology

## 2015-12-08 ENCOUNTER — Telehealth: Payer: Self-pay | Admitting: Gastroenterology

## 2015-12-08 NOTE — Telephone Encounter (Signed)
Please call pt. HE had ONE HYPERPLASTIC POLYP removed.    FOLLOW A HIGH FIBER DIET. AVOID ITEMS THAT CAUSE BLOATING & GAS.  NEXT COLONOSCOPY IN 5 YEARS DUE TO  POOR PREP IN RIGHT COLON.

## 2015-12-09 NOTE — Telephone Encounter (Signed)
Reminder in epic °

## 2015-12-09 NOTE — Telephone Encounter (Signed)
Pt is aware of results. 

## 2016-04-06 ENCOUNTER — Encounter: Payer: Self-pay | Admitting: Physician Assistant

## 2016-04-06 ENCOUNTER — Ambulatory Visit: Payer: Medicaid Other | Admitting: Physician Assistant

## 2016-04-06 VITALS — BP 144/88 | HR 88 | Temp 97.7°F | Ht 70.0 in | Wt 221.2 lb

## 2016-04-06 DIAGNOSIS — Z131 Encounter for screening for diabetes mellitus: Secondary | ICD-10-CM

## 2016-04-06 DIAGNOSIS — Z8673 Personal history of transient ischemic attack (TIA), and cerebral infarction without residual deficits: Secondary | ICD-10-CM

## 2016-04-06 DIAGNOSIS — F1010 Alcohol abuse, uncomplicated: Secondary | ICD-10-CM

## 2016-04-06 DIAGNOSIS — I10 Essential (primary) hypertension: Secondary | ICD-10-CM

## 2016-04-06 DIAGNOSIS — F17210 Nicotine dependence, cigarettes, uncomplicated: Secondary | ICD-10-CM

## 2016-04-06 DIAGNOSIS — Z125 Encounter for screening for malignant neoplasm of prostate: Secondary | ICD-10-CM

## 2016-04-06 DIAGNOSIS — E785 Hyperlipidemia, unspecified: Secondary | ICD-10-CM

## 2016-04-06 LAB — GLUCOSE, POCT (MANUAL RESULT ENTRY): POC GLUCOSE: 107 mg/dL — AB (ref 70–99)

## 2016-04-06 NOTE — Progress Notes (Signed)
BP 144/88 mmHg  Pulse 88  Temp(Src) 97.7 F (36.5 C)  Ht 5\' 10"  (1.778 m)  Wt 221 lb 4 oz (100.358 kg)  BMI 31.75 kg/m2  SpO2 96%   Subjective:    Patient ID: Drew Cruz, male    DOB: 27-Jun-1956, 60 y.o.   MRN: [id number]  HPI: Drew Cruz is a 60 y.o. male presenting on 04/06/2016 for New Patient (Initial Visit)   HPI   Chief Complaint  Patient presents with  . New Patient (Initial Visit)    pt did not bring meds. Pt states he is taking meds for HTN and Hyperlipidemia, but is unsure of the names. Pt is also taking vitamins and an unknown dosage of aspirin. pt last PCP was Dr. Silvestre Moment at [place] had a1c done 04/09/15- was 5.7 Cbc done 11/19/15 was normal b-12 and tsh done 04/09/15 were normal  Pt says he is feeling good today and has no complaints, just wants to get established.  Relevant past medical, surgical, family and social history reviewed and updated as indicated. Interim medical history since our last visit reviewed. Allergies and medications reviewed and updated.  CURRENT MEDS: Unknown- meds for bp and chol and asa daily  Review of Systems  Constitutional: Negative for fever, chills, diaphoresis, appetite change, fatigue and unexpected weight change.  HENT: Negative for congestion, dental problem, drooling, ear pain, facial swelling, hearing loss, mouth sores, sneezing, sore throat, trouble swallowing and voice change.   Eyes: Positive for discharge and itching. Negative for pain, redness and visual disturbance.  Respiratory: Negative for cough, choking, shortness of breath and wheezing.   Cardiovascular: Negative for chest pain, palpitations and leg swelling.  Gastrointestinal: Negative for vomiting, abdominal pain, diarrhea, constipation and blood in stool.  Endocrine: Negative for cold intolerance, heat intolerance and polydipsia.  Genitourinary: Negative for dysuria, hematuria and decreased urine volume.  Musculoskeletal: Negative for back pain, arthralgias  and gait problem.  Skin: Negative for rash.  Allergic/Immunologic: Negative for environmental allergies.  Neurological: Negative for seizures, syncope, light-headedness and headaches.  Hematological: Negative for adenopathy.  Psychiatric/Behavioral: Negative for suicidal ideas, dysphoric mood and agitation. The patient is not nervous/anxious.     Per HPI unless specifically indicated above     Objective:    BP 144/88 mmHg  Pulse 88  Temp(Src) 97.7 F (36.5 C)  Ht 5\' 10"  (1.778 m)  Wt 221 lb 4 oz (100.358 kg)  BMI 31.75 kg/m2  SpO2 96%  Wt Readings from Last 3 Encounters:  04/06/16 221 lb 4 oz (100.358 kg)  11/19/15 223 lb (101.152 kg)  10/29/15 220 lb 12.8 oz (100.154 kg)    Physical Exam  Constitutional: He is oriented to person, place, and time. He appears well-developed and well-nourished.  HENT:  Head: Normocephalic and atraumatic.  Mouth/Throat: Oropharynx is clear and moist. No oropharyngeal exudate.  Eyes: Conjunctivae and EOM are normal. Pupils are equal, round, and reactive to light.  Neck: Neck supple. No thyromegaly present.  Cardiovascular: Normal rate and regular rhythm.   Pulmonary/Chest: Effort normal and breath sounds normal. He has no wheezes. He has no rales.  Abdominal: Soft. Bowel sounds are normal. He exhibits no mass. There is no hepatosplenomegaly. There is no tenderness.  Musculoskeletal: He exhibits no edema.  Lymphadenopathy:    He has no cervical adenopathy.  Neurological: He is alert and oriented to person, place, and time.  Skin: Skin is warm and dry. No rash noted.  Psychiatric: He has a  normal mood and affect. His behavior is normal. Thought content normal.  Vitals reviewed.   Results for orders placed or performed in visit on 04/06/16  POCT Glucose (CBG)  Result Value Ref Range   POC Glucose 107 (A) 70 - 99 mg/dl      Assessment & Plan:   Encounter Diagnoses  Name Primary?  . Essential hypertension Yes  . Hyperlipidemia   .  Cigarette nicotine dependence without complication   . ETOH abuse   . History of CVA (cerebrovascular accident)   . Screening for diabetes mellitus   . Screening for prostate cancer     -check fasting labs- Cmp, lipid, psa -counseled pt to bring all meds with him to every appointment.  He says he just got refills yesterday so he has enough to last  -counseled pt on limiting etoh if not stopping altogether -counseled on smoking cessation -f/u 4 wk.  RTO sooner prn

## 2016-04-07 ENCOUNTER — Other Ambulatory Visit: Payer: Self-pay | Admitting: Physician Assistant

## 2016-04-07 LAB — LIPID PANEL
CHOL/HDL RATIO: 2.6 ratio (ref <=5.0)
Cholesterol: 149 mg/dL (ref 125–200)
HDL: 58 mg/dL (ref >=40)
LDL CALC: 68 mg/dL (ref <130)
Triglycerides: 113 mg/dL (ref <150)
VLDL: 23 mg/dL (ref <30)

## 2016-04-07 LAB — COMPREHENSIVE METABOLIC PANEL
ALT: 28 U/L (ref 9–46)
AST: 18 U/L (ref 10–35)
Albumin: 4.1 g/dL (ref 3.6–5.1)
Alkaline Phosphatase: 53 U/L (ref 40–115)
BUN: 13 mg/dL (ref 7–25)
CHLORIDE: 103 mmol/L (ref 98–110)
CO2: 25 mmol/L (ref 20–31)
CREATININE: 1 mg/dL (ref 0.70–1.33)
Calcium: 9.5 mg/dL (ref 8.6–10.3)
Glucose, Bld: 80 mg/dL (ref 65–99)
Potassium: 4 mmol/L (ref 3.5–5.3)
SODIUM: 138 mmol/L (ref 135–146)
Total Bilirubin: 0.5 mg/dL (ref 0.2–1.2)
Total Protein: 7.4 g/dL (ref 6.1–8.1)

## 2016-04-08 LAB — PSA: PSA: 2.72 ng/mL (ref <=4.00)

## 2016-05-03 ENCOUNTER — Ambulatory Visit: Payer: Self-pay | Admitting: Physician Assistant

## 2016-05-09 ENCOUNTER — Encounter: Payer: Self-pay | Admitting: Physician Assistant

## 2016-05-09 ENCOUNTER — Ambulatory Visit: Payer: Medicaid Other | Admitting: Physician Assistant

## 2016-05-09 VITALS — BP 150/80 | HR 80 | Temp 97.9°F | Ht 70.0 in | Wt 224.5 lb

## 2016-05-09 DIAGNOSIS — I10 Essential (primary) hypertension: Secondary | ICD-10-CM

## 2016-05-09 DIAGNOSIS — R05 Cough: Secondary | ICD-10-CM

## 2016-05-09 DIAGNOSIS — Z720 Tobacco use: Secondary | ICD-10-CM

## 2016-05-09 DIAGNOSIS — E785 Hyperlipidemia, unspecified: Secondary | ICD-10-CM

## 2016-05-09 DIAGNOSIS — F1010 Alcohol abuse, uncomplicated: Secondary | ICD-10-CM

## 2016-05-09 DIAGNOSIS — R059 Cough, unspecified: Secondary | ICD-10-CM

## 2016-05-09 MED ORDER — AMLODIPINE BESYLATE 5 MG PO TABS: 5.0000 mg | ORAL_TABLET | Freq: Every day | ORAL | 3 refills | Status: DC

## 2016-05-09 NOTE — Progress Notes (Signed)
BP (!) 150/80 (BP Location: Left Arm, Patient Position: Sitting, Cuff Size: Normal)   Pulse 80   Temp 97.9 F (36.6 C) (Other (Comment))   Ht 5\' 10"  (1.778 m)   Wt 224 lb 8 oz (101.8 kg)   SpO2 97%   BMI 32.21 kg/m    Subjective:    Patient ID: Drew Cruz, male    DOB: [birth year], 60 y.o.   MRN: [id number]  HPI: Drew Cruz is a 60 y.o. male presenting on 05/09/2016 for Hyperlipidemia and Hypertension   HPI Pt doing well.  He says he "coughs all the time" .  He says he had the cough prior to starting lisinopril.    Pt says he is drinking a  40 ounce beer almost every day.    Relevant past medical, surgical, family and social history reviewed and updated as indicated. Interim medical history since our last visit reviewed. Allergies and medications reviewed and updated.   Current Outpatient Prescriptions:  .  aspirin 325 MG tablet, Take 1 tablet (325 mg total) by mouth daily., Disp: 100 tablet, Rfl: 3 .  atorvastatin (LIPITOR) 20 MG tablet, Take 80 mg by mouth daily. , Disp: , Rfl: 11 .  Cholecalciferol (VITAMIN D) 2000 units CAPS, Take 1 capsule by mouth daily., Disp: , Rfl:  .  lisinopril (PRINIVIL,ZESTRIL) 10 MG tablet, Take 10 mg by mouth daily., Disp: , Rfl: 11 .  loratadine (CLARITIN) 10 MG tablet, Take 10 mg by mouth daily., Disp: , Rfl:   Review of Systems  Constitutional: Negative for appetite change, chills, diaphoresis, fatigue, fever and unexpected weight change.  HENT: Negative for congestion, dental problem, drooling, ear pain, facial swelling, hearing loss, mouth sores, sneezing, sore throat, trouble swallowing and voice change.   Eyes: Positive for discharge. Negative for pain, redness, itching and visual disturbance.  Respiratory: Positive for cough. Negative for choking, shortness of breath and wheezing.   Cardiovascular: Negative for chest pain, palpitations and leg swelling.  Gastrointestinal: Negative for abdominal pain, blood in stool, constipation, diarrhea  and vomiting.  Endocrine: Negative for cold intolerance, heat intolerance and polydipsia.  Genitourinary: Negative for decreased urine volume, dysuria and hematuria.  Musculoskeletal: Negative for arthralgias, back pain and gait problem.  Skin: Negative for rash.  Allergic/Immunologic: Negative for environmental allergies.  Neurological: Negative for seizures, syncope, light-headedness and headaches.  Hematological: Negative for adenopathy.  Psychiatric/Behavioral: Negative for agitation, dysphoric mood and suicidal ideas. The patient is not nervous/anxious.     Per HPI unless specifically indicated above     Objective:    BP (!) 150/80 (BP Location: Left Arm, Patient Position: Sitting, Cuff Size: Normal)   Pulse 80   Temp 97.9 F (36.6 C) (Other (Comment))   Ht 5\' 10"  (1.778 m)   Wt 224 lb 8 oz (101.8 kg)   SpO2 97%   BMI 32.21 kg/m   Wt Readings from Last 3 Encounters:  05/09/16 224 lb 8 oz (101.8 kg)  04/06/16 221 lb 4 oz (100.4 kg)  11/19/15 223 lb (101.2 kg)    Physical Exam  Constitutional: He is oriented to person, place, and time. He appears well-developed and well-nourished.  HENT:  Head: Normocephalic and atraumatic.  Neck: Neck supple.  Cardiovascular: Normal rate and regular rhythm.   Pulmonary/Chest: Effort normal and breath sounds normal. He has no wheezes.  Abdominal: Soft. Bowel sounds are normal. There is no hepatosplenomegaly. There is no tenderness.  Musculoskeletal: He exhibits no edema.  Lymphadenopathy:    He  has no cervical adenopathy.  Neurological: He is alert and oriented to person, place, and time.  Skin: Skin is warm and dry.  Psychiatric: He has a normal mood and affect. His behavior is normal.  Vitals reviewed.   Results for orders placed or performed in visit on 04/07/16  Comprehensive metabolic panel  Result Value Ref Range   Sodium 138 135 - 146 mmol/L   Potassium 4.0 3.5 - 5.3 mmol/L   Chloride 103 98 - 110 mmol/L   CO2 25 20 - 31  mmol/L   Glucose, Bld 80 65 - 99 mg/dL   BUN 13 7 - 25 mg/dL   Creat 1.00 0.70 - 1.33 mg/dL   Total Bilirubin 0.5 0.2 - 1.2 mg/dL   Alkaline Phosphatase 53 40 - 115 U/L   AST 18 10 - 35 U/L   ALT 28 9 - 46 U/L   Total Protein 7.4 6.1 - 8.1 g/dL   Albumin 4.1 3.6 - 5.1 g/dL   Calcium 9.5 8.6 - 10.3 mg/dL  Lipid panel  Result Value Ref Range   Cholesterol 149 125 - 200 mg/dL   Triglycerides 113 <150 mg/dL   HDL 58 >=40 mg/dL   Total CHOL/HDL Ratio 2.6 <=5.0 Ratio   VLDL 23 <30 mg/dL   LDL Cholesterol 68 <130 mg/dL  PSA  Result Value Ref Range   PSA 2.72 <=4.00 ng/mL      Assessment & Plan:    Encounter Diagnoses  Name Primary?  . Essential hypertension Yes  . Hyperlipidemia   . Tobacco abuse   . Cough   . ETOH abuse     -Reviewed labs with pt- all good -Pt states he doesn't need help with etoh- he says he has already slowed down a lot.  Discussed with him that 52 ounces/day is too much.  He states understanding and says he will cut back -get cxr for cough.  Discussed lisinopril with pt and he is quite insistent that he had the cough prior to starting lisinopril -counseled on smoking cessation which will help the cough -Add amlodiopine for bp -f/u 1 month to recheck the BP

## 2016-05-10 ENCOUNTER — Ambulatory Visit (HOSPITAL_COMMUNITY)
Admission: RE | Admit: 2016-05-10 | Discharge: 2016-05-10 | Disposition: A | Discharge status: Home / Self Care | Payer: Medicaid Other | Source: Ambulatory Visit | Attending: Physician Assistant | Admitting: Physician Assistant

## 2016-05-10 DIAGNOSIS — R05 Cough: Secondary | ICD-10-CM | POA: Insufficient documentation

## 2016-05-10 DIAGNOSIS — R937 Abnormal findings on diagnostic imaging of other parts of musculoskeletal system: Secondary | ICD-10-CM | POA: Diagnosis not present

## 2016-05-10 DIAGNOSIS — I517 Cardiomegaly: Secondary | ICD-10-CM | POA: Insufficient documentation

## 2016-05-10 DIAGNOSIS — Z720 Tobacco use: Secondary | ICD-10-CM | POA: Diagnosis present

## 2016-06-08 ENCOUNTER — Encounter: Payer: Self-pay | Admitting: Physician Assistant

## 2016-06-08 ENCOUNTER — Ambulatory Visit: Payer: Medicaid Other | Admitting: Physician Assistant

## 2016-06-08 VITALS — BP 140/72 | HR 100 | Temp 97.7°F | Ht 70.0 in | Wt 223.2 lb

## 2016-06-08 DIAGNOSIS — I10 Essential (primary) hypertension: Secondary | ICD-10-CM

## 2016-06-08 DIAGNOSIS — R059 Cough, unspecified: Secondary | ICD-10-CM

## 2016-06-08 DIAGNOSIS — R05 Cough: Secondary | ICD-10-CM

## 2016-06-08 NOTE — Progress Notes (Signed)
BP 140/72 (BP Location: Left Arm, Patient Position: Sitting, Cuff Size: Normal)   Pulse 100   Temp 97.7 F (36.5 C)   Ht 5\' 10"  (1.778 m)   Wt 223 lb 4 oz (101.3 kg)   SpO2 93%   BMI 32.03 kg/m    Subjective:    Patient ID: Drew Cruz, male    DOB: 12-Mar-1956, 60 y.o.   MRN: [id number]  HPI: Drew Cruz is a 60 y.o. male presenting on 06/08/2016 for Hypertension and Cough   HPI   Pt didn't bring his medications and he doesn't know what he is taking.   He says his cough is a little bit better.    He says he has slowed down his drinking- only 1 or 2 of the 12 ounce cans daily  Relevant past medical, surgical, family and social history reviewed and updated as indicated. Interim medical history since our last visit reviewed. Allergies  reviewed and updated.  CURRENT MEDS: Unknown  Review of Systems  Constitutional: Negative for appetite change, chills, diaphoresis, fatigue, fever and unexpected weight change.  HENT: Negative for congestion, drooling, ear pain, facial swelling, hearing loss, mouth sores, sneezing, sore throat, trouble swallowing and voice change.   Eyes: Negative for pain, discharge, redness, itching and visual disturbance.  Respiratory: Positive for cough. Negative for choking, shortness of breath and wheezing.   Cardiovascular: Negative for chest pain, palpitations and leg swelling.  Gastrointestinal: Negative for abdominal pain, blood in stool, constipation, diarrhea and vomiting.  Endocrine: Negative for cold intolerance, heat intolerance and polydipsia.  Genitourinary: Negative for decreased urine volume, dysuria and hematuria.  Musculoskeletal: Negative for arthralgias, back pain and gait problem.  Skin: Negative for rash.  Allergic/Immunologic: Negative for environmental allergies.  Neurological: Negative for seizures, syncope, light-headedness and headaches.  Hematological: Negative for adenopathy.  Psychiatric/Behavioral: Negative for agitation,  dysphoric mood and suicidal ideas. The patient is not nervous/anxious.     Per HPI unless specifically indicated above     Objective:    BP 140/72 (BP Location: Left Arm, Patient Position: Sitting, Cuff Size: Normal)   Pulse 100   Temp 97.7 F (36.5 C)   Ht 5\' 10"  (1.778 m)   Wt 223 lb 4 oz (101.3 kg)   SpO2 93%   BMI 32.03 kg/m   Wt Readings from Last 3 Encounters:  06/08/16 223 lb 4 oz (101.3 kg)  05/09/16 224 lb 8 oz (101.8 kg)  04/06/16 221 lb 4 oz (100.4 kg)    Physical Exam  Constitutional: He is oriented to person, place, and time. He appears well-developed and well-nourished.  HENT:  Head: Normocephalic and atraumatic.  Neck: Neck supple.  Cardiovascular: Normal rate and regular rhythm.   Pulmonary/Chest: Effort normal and breath sounds normal. He has no wheezes.  Abdominal: Soft. Bowel sounds are normal. There is no hepatosplenomegaly. There is no tenderness.  Musculoskeletal: He exhibits no edema.  Lymphadenopathy:    He has no cervical adenopathy.  Neurological: He is alert and oriented to person, place, and time.  Skin: Skin is warm and dry.  Psychiatric: He has a normal mood and affect. His behavior is normal.  Vitals reviewed.       Assessment & Plan:   Encounter Diagnoses  Name Primary?  . Essential hypertension Yes  . Cough       -reviewed CXR results with pt -discussed importance of bringing all meds to every appointment -counseled pt again that he needs to cut back etoh either to  every now and then or none ever, that 1-2 is too much for him in light of his medical history -f/u 1 month recheck bp and review medications

## 2016-07-07 ENCOUNTER — Other Ambulatory Visit: Payer: Self-pay | Admitting: Physician Assistant

## 2016-07-11 ENCOUNTER — Encounter: Payer: Self-pay | Admitting: Physician Assistant

## 2016-07-11 ENCOUNTER — Ambulatory Visit: Payer: Medicaid Other | Admitting: Physician Assistant

## 2016-07-11 VITALS — BP 130/76 | HR 78 | Temp 97.2°F | Ht 70.0 in | Wt 224.0 lb

## 2016-07-11 DIAGNOSIS — F1010 Alcohol abuse, uncomplicated: Secondary | ICD-10-CM

## 2016-07-11 DIAGNOSIS — Z8673 Personal history of transient ischemic attack (TIA), and cerebral infarction without residual deficits: Secondary | ICD-10-CM

## 2016-07-11 DIAGNOSIS — I10 Essential (primary) hypertension: Secondary | ICD-10-CM

## 2016-07-11 DIAGNOSIS — E785 Hyperlipidemia, unspecified: Secondary | ICD-10-CM

## 2016-07-11 DIAGNOSIS — F17210 Nicotine dependence, cigarettes, uncomplicated: Secondary | ICD-10-CM

## 2016-07-11 NOTE — Progress Notes (Signed)
BP 130/76 (BP Location: Left Arm, Patient Position: Sitting, Cuff Size: Normal)   Pulse 78   Temp 97.2 F (36.2 C)   Ht 5\' 10"  (1.778 m)   Wt 224 lb (101.6 kg)   SpO2 95%   BMI 32.14 kg/m    Subjective:    Patient ID: Drew Cruz, male    DOB: Jan 02, 1956, 60 y.o.   MRN: [id number]  HPI: Jaylani Sonsalla is a 60 y.o. male presenting on 07/11/2016 for Hypertension   HPI   Pt brought his meds in with him today.  He says he never got the amlodipine for his bp.  He has only been taking the lisinopril.  Pt states he still has a little bit of cough.  He says it is mostly when he is eating.   Pt states still drinking etoh 1 or 2 daily.  He is still smoking.  Relevant past medical, surgical, family and social history reviewed and updated as indicated. Interim medical history since our last visit reviewed. Allergies and medications reviewed and updated.   Current Outpatient Prescriptions:  .  aspirin 325 MG tablet, Take 1 tablet (325 mg total) by mouth daily., Disp: 100 tablet, Rfl: 3 .  atorvastatin (LIPITOR) 20 MG tablet, Take 80 mg by mouth daily. , Disp: , Rfl: 11 .  lisinopril (PRINIVIL,ZESTRIL) 10 MG tablet, Take 10 mg by mouth daily., Disp: , Rfl: 11  Review of Systems  Constitutional: Negative for appetite change, chills, diaphoresis, fatigue, fever and unexpected weight change.  HENT: Negative for congestion, dental problem, drooling, ear pain, facial swelling, hearing loss, mouth sores, sneezing, sore throat, trouble swallowing and voice change.   Eyes: Positive for discharge. Negative for pain, redness, itching and visual disturbance.  Respiratory: Negative for cough, choking, shortness of breath and wheezing.   Cardiovascular: Negative for chest pain, palpitations and leg swelling.  Gastrointestinal: Negative for abdominal pain, blood in stool, constipation, diarrhea and vomiting.  Endocrine: Negative for cold intolerance, heat intolerance and polydipsia.  Genitourinary: Negative  for decreased urine volume, dysuria and hematuria.  Musculoskeletal: Positive for arthralgias. Negative for back pain and gait problem.  Skin: Negative for rash.  Allergic/Immunologic: Negative for environmental allergies.  Neurological: Negative for seizures, syncope, light-headedness and headaches.  Hematological: Negative for adenopathy.  Psychiatric/Behavioral: Negative for agitation, dysphoric mood and suicidal ideas. The patient is not nervous/anxious.     Per HPI unless specifically indicated above     Objective:    BP 130/76 (BP Location: Left Arm, Patient Position: Sitting, Cuff Size: Normal)   Pulse 78   Temp 97.2 F (36.2 C)   Ht 5\' 10"  (1.778 m)   Wt 224 lb (101.6 kg)   SpO2 95%   BMI 32.14 kg/m   Wt Readings from Last 3 Encounters:  07/11/16 224 lb (101.6 kg)  06/08/16 223 lb 4 oz (101.3 kg)  05/09/16 224 lb 8 oz (101.8 kg)    Physical Exam  Constitutional: He is oriented to person, place, and time. He appears well-developed and well-nourished.  HENT:  Head: Normocephalic and atraumatic.  Neck: Neck supple.  Cardiovascular: Normal rate and regular rhythm.   Pulmonary/Chest: Effort normal and breath sounds normal. He has no wheezes.  Abdominal: Soft. Bowel sounds are normal. There is no hepatosplenomegaly. There is no tenderness.  Musculoskeletal: He exhibits no edema.  Lymphadenopathy:    He has no cervical adenopathy.  Neurological: He is alert and oriented to person, place, and time.  Skin: Skin is warm and  dry.  Psychiatric: He has a normal mood and affect. His behavior is normal.  Vitals reviewed.       Assessment & Plan:    Encounter Diagnoses  Name Primary?  . Essential hypertension Yes  . Hyperlipidemia, unspecified hyperlipidemia type   . Cigarette nicotine dependence without complication   . ETOH abuse   . History of CVA (cerebrovascular accident)     -Will monitor bp on currrent medications. -Counseled pt again on need to avoid etoh.  Counseled smoking cessation -F/u 2 months with labs before appointment (pt has medicaid)

## 2016-08-31 ENCOUNTER — Other Ambulatory Visit: Payer: Self-pay

## 2016-08-31 DIAGNOSIS — E785 Hyperlipidemia, unspecified: Secondary | ICD-10-CM

## 2016-08-31 DIAGNOSIS — I10 Essential (primary) hypertension: Secondary | ICD-10-CM

## 2016-09-01 LAB — COMPREHENSIVE METABOLIC PANEL
ALK PHOS: 69 U/L (ref 40–115)
ALT: 49 U/L — AB (ref 9–46)
AST: 24 U/L (ref 10–35)
Albumin: 4.2 g/dL (ref 3.6–5.1)
BUN: 12 mg/dL (ref 7–25)
CHLORIDE: 106 mmol/L (ref 98–110)
CO2: 25 mmol/L (ref 20–31)
CREATININE: 0.97 mg/dL (ref 0.70–1.25)
Calcium: 9.5 mg/dL (ref 8.6–10.3)
GLUCOSE: 113 mg/dL — AB (ref 65–99)
Potassium: 4.2 mmol/L (ref 3.5–5.3)
SODIUM: 141 mmol/L (ref 135–146)
TOTAL PROTEIN: 7.2 g/dL (ref 6.1–8.1)
Total Bilirubin: 0.3 mg/dL (ref 0.2–1.2)

## 2016-09-01 LAB — LIPID PANEL
CHOL/HDL RATIO: 3.1 ratio (ref <5.0)
CHOLESTEROL: 139 mg/dL (ref <200)
HDL: 45 mg/dL (ref >40)
LDL Cholesterol: 75 mg/dL (ref <100)
Triglycerides: 97 mg/dL (ref <150)
VLDL: 19 mg/dL (ref <30)

## 2016-09-08 ENCOUNTER — Encounter: Payer: Self-pay | Admitting: Physician Assistant

## 2016-09-08 ENCOUNTER — Ambulatory Visit: Payer: Medicaid Other | Admitting: Physician Assistant

## 2016-09-08 VITALS — BP 130/78 | HR 87 | Temp 98.6°F | Ht 70.0 in | Wt 225.0 lb

## 2016-09-08 DIAGNOSIS — F17210 Nicotine dependence, cigarettes, uncomplicated: Secondary | ICD-10-CM

## 2016-09-08 DIAGNOSIS — I10 Essential (primary) hypertension: Secondary | ICD-10-CM

## 2016-09-08 DIAGNOSIS — Z23 Encounter for immunization: Secondary | ICD-10-CM

## 2016-09-08 DIAGNOSIS — F1010 Alcohol abuse, uncomplicated: Secondary | ICD-10-CM

## 2016-09-08 DIAGNOSIS — E785 Hyperlipidemia, unspecified: Secondary | ICD-10-CM

## 2016-09-08 MED ORDER — LISINOPRIL 20 MG PO TABS: 20.0000 mg | ORAL_TABLET | Freq: Every day | ORAL | 4 refills | Status: DC

## 2016-09-08 NOTE — Progress Notes (Signed)
BP 130/78 (BP Location: Left Arm, Patient Position: Sitting, Cuff Size: Normal)   Pulse 87   Temp 98.6 F (37 C)   Ht 5\' 10"  (1.778 m)   Wt 225 lb (102.1 kg)   SpO2 97%   BMI 32.28 kg/m    Subjective:    Patient ID: Drew Cruz, male    DOB: May 30, 1956, 60 y.o.   MRN: [id number]  HPI: Drew Cruz is a 60 y.o. male presenting on 09/08/2016 for Hypertension and Hyperlipidemia   HPI Pt is still smoking.  Pt is still drinks 2 or 3 beer daily.  He is feeling well today  Relevant past medical, surgical, family and social history reviewed and updated as indicated. Interim medical history since our last visit reviewed. Allergies and medications reviewed and updated.   Current Outpatient Prescriptions:  .  aspirin 325 MG tablet, Take 1 tablet (325 mg total) by mouth daily., Disp: 100 tablet, Rfl: 3 .  atorvastatin (LIPITOR) 20 MG tablet, Take 80 mg by mouth daily. , Disp: , Rfl: 11 .  lisinopril (PRINIVIL,ZESTRIL) 10 MG tablet, TAKE 1 TABLET (10 MG) BY ORAL ROUTE ONCE DAILY FOR 30 DAYS, Disp: 30 tablet, Rfl: 3 .  naproxen sodium (ANAPROX) 220 MG tablet, Take 440 mg by mouth daily as needed., Disp: , Rfl:    Review of Systems  Constitutional: Negative for appetite change, chills, diaphoresis, fatigue, fever and unexpected weight change.  HENT: Negative for congestion, dental problem, drooling, ear pain, facial swelling, hearing loss, mouth sores, sneezing, sore throat, trouble swallowing and voice change.   Eyes: Positive for discharge. Negative for pain, redness, itching and visual disturbance.  Respiratory: Positive for cough. Negative for choking, shortness of breath and wheezing.   Cardiovascular: Negative for chest pain, palpitations and leg swelling.  Gastrointestinal: Negative for abdominal pain, blood in stool, constipation, diarrhea and vomiting.  Endocrine: Negative for cold intolerance, heat intolerance and polydipsia.  Genitourinary: Negative for decreased urine volume, dysuria  and hematuria.  Musculoskeletal: Positive for back pain and gait problem. Negative for arthralgias.  Skin: Negative for rash.  Allergic/Immunologic: Negative for environmental allergies.  Neurological: Negative for seizures, syncope, light-headedness and headaches.  Hematological: Negative for adenopathy.  Psychiatric/Behavioral: Negative for agitation, dysphoric mood and suicidal ideas. The patient is not nervous/anxious.     Per HPI unless specifically indicated above     Objective:    BP 130/78 (BP Location: Left Arm, Patient Position: Sitting, Cuff Size: Normal)   Pulse 87   Temp 98.6 F (37 C)   Ht 5\' 10"  (1.778 m)   Wt 225 lb (102.1 kg)   SpO2 97%   BMI 32.28 kg/m   Wt Readings from Last 3 Encounters:  09/08/16 225 lb (102.1 kg)  07/11/16 224 lb (101.6 kg)  06/08/16 223 lb 4 oz (101.3 kg)    Physical Exam  Constitutional: He is oriented to person, place, and time. He appears well-developed and well-nourished.  HENT:  Head: Normocephalic and atraumatic.  Neck: Neck supple.  Cardiovascular: Normal rate and regular rhythm.   Pulmonary/Chest: Effort normal and breath sounds normal. He has no wheezes.  Abdominal: Soft. Bowel sounds are normal. There is no hepatosplenomegaly. There is no tenderness.  Musculoskeletal: He exhibits no edema.  Lymphadenopathy:    He has no cervical adenopathy.  Neurological: He is alert and oriented to person, place, and time.  Skin: Skin is warm and dry.  Psychiatric: He has a normal mood and affect. His behavior is normal.  Vitals reviewed.   Results for orders placed or performed in visit on 08/31/16  Comprehensive Metabolic Panel (CMET)  Result Value Ref Range   Sodium 141 135 - 146 mmol/L   Potassium 4.2 3.5 - 5.3 mmol/L   Chloride 106 98 - 110 mmol/L   CO2 25 20 - 31 mmol/L   Glucose, Bld 113 (H) 65 - 99 mg/dL   BUN 12 7 - 25 mg/dL   Creat 0.97 0.70 - 1.25 mg/dL   Total Bilirubin 0.3 0.2 - 1.2 mg/dL   Alkaline Phosphatase 69  40 - 115 U/L   AST 24 10 - 35 U/L   ALT 49 (H) 9 - 46 U/L   Total Protein 7.2 6.1 - 8.1 g/dL   Albumin 4.2 3.6 - 5.1 g/dL   Calcium 9.5 8.6 - 10.3 mg/dL  Lipid Profile  Result Value Ref Range   Cholesterol 139 <200 mg/dL   Triglycerides 97 <150 mg/dL   HDL 45 >40 mg/dL   Total CHOL/HDL Ratio 3.1 <5.0 Ratio   VLDL 19 <30 mg/dL   LDL Cholesterol 75 <100 mg/dL      Assessment & Plan:   Encounter Diagnoses  Name Primary?  . Essential hypertension Yes  . Hyperlipidemia, unspecified hyperlipidemia type   . Need for prophylactic vaccination and inoculation against influenza   . Cigarette nicotine dependence without complication   . ETOH abuse     -reviewed labs with pt  -Increase lisinopril to 20mg  -Contine current atorvastatin -Counseled smoking cessation and decreaseing etoh to fewer days and fewer drinks on the days that he drinks -Flu shot today -follow up 3 months.  RTO sooner prn

## 2016-11-28 ENCOUNTER — Other Ambulatory Visit: Payer: Self-pay

## 2016-11-28 DIAGNOSIS — E785 Hyperlipidemia, unspecified: Secondary | ICD-10-CM

## 2016-11-28 DIAGNOSIS — I10 Essential (primary) hypertension: Secondary | ICD-10-CM

## 2016-11-29 LAB — COMPREHENSIVE METABOLIC PANEL
ALK PHOS: 64 U/L (ref 40–115)
ALT: 34 U/L (ref 9–46)
AST: 18 U/L (ref 10–35)
Albumin: 4.2 g/dL (ref 3.6–5.1)
BILIRUBIN TOTAL: 0.4 mg/dL (ref 0.2–1.2)
BUN: 11 mg/dL (ref 7–25)
CO2: 26 mmol/L (ref 20–31)
CREATININE: 0.88 mg/dL (ref 0.70–1.25)
Calcium: 9.2 mg/dL (ref 8.6–10.3)
Chloride: 106 mmol/L (ref 98–110)
GLUCOSE: 93 mg/dL (ref 65–99)
POTASSIUM: 4.4 mmol/L (ref 3.5–5.3)
Sodium: 140 mmol/L (ref 135–146)
TOTAL PROTEIN: 7.5 g/dL (ref 6.1–8.1)

## 2016-11-29 LAB — LIPID PANEL
CHOL/HDL RATIO: 3.1 ratio (ref <5.0)
Cholesterol: 144 mg/dL (ref <200)
HDL: 46 mg/dL (ref >40)
LDL Cholesterol: 77 mg/dL (ref <100)
Triglycerides: 104 mg/dL (ref <150)
VLDL: 21 mg/dL (ref <30)

## 2016-12-07 ENCOUNTER — Ambulatory Visit: Payer: Medicaid Other | Admitting: Physician Assistant

## 2016-12-07 ENCOUNTER — Encounter: Payer: Self-pay | Admitting: Physician Assistant

## 2016-12-07 VITALS — BP 120/86 | HR 73 | Temp 97.9°F | Ht 70.0 in | Wt 217.0 lb

## 2016-12-07 DIAGNOSIS — I10 Essential (primary) hypertension: Secondary | ICD-10-CM

## 2016-12-07 DIAGNOSIS — F1010 Alcohol abuse, uncomplicated: Secondary | ICD-10-CM

## 2016-12-07 DIAGNOSIS — E785 Hyperlipidemia, unspecified: Secondary | ICD-10-CM

## 2016-12-07 DIAGNOSIS — F17210 Nicotine dependence, cigarettes, uncomplicated: Secondary | ICD-10-CM

## 2016-12-07 NOTE — Progress Notes (Signed)
BP 120/86 (BP Location: Left Arm, Patient Position: Sitting, Cuff Size: Normal)   Pulse 73   Temp 97.9 F (36.6 C)   Ht 5\' 10"  (1.778 m)   Wt 217 lb (98.4 kg)   SpO2 99%   BMI 31.14 kg/m    Subjective:    Patient ID: Drew Cruz, male    DOB: Nov 20, 1955, 61 y.o.   MRN: [id number]  HPI: Drew Cruz is a 61 y.o. male presenting on 12/07/2016 for Hyperlipidemia and Hypertension   HPI  Pt has medicaid  Pt is still smoking.  Pt drinks 2-3 days /week.  Usually one 40 ounce can of beer on those day  Pt is feeling well today  Relevant past medical, surgical, family and social history reviewed and updated as indicated. Interim medical history since our last visit reviewed. Allergies and medications reviewed and updated.   Current Outpatient Prescriptions:  .  aspirin 325 MG tablet, Take 1 tablet (325 mg total) by mouth daily., Disp: 100 tablet, Rfl: 3 .  atorvastatin (LIPITOR) 20 MG tablet, Take 80 mg by mouth daily. , Disp: , Rfl: 11 .  lisinopril (PRINIVIL,ZESTRIL) 20 MG tablet, Take 1 tablet (20 mg total) by mouth daily., Disp: 30 tablet, Rfl: 4   Review of Systems  Constitutional: Negative for appetite change, chills, diaphoresis, fatigue, fever and unexpected weight change.  HENT: Negative for congestion, dental problem, drooling, ear pain, facial swelling, hearing loss, mouth sores, sneezing, sore throat, trouble swallowing and voice change.   Eyes: Negative for pain, discharge, redness, itching and visual disturbance.  Respiratory: Positive for cough. Negative for choking, shortness of breath and wheezing.   Cardiovascular: Negative for chest pain, palpitations and leg swelling.  Gastrointestinal: Negative for abdominal pain, blood in stool, constipation, diarrhea and vomiting.  Endocrine: Negative for cold intolerance, heat intolerance and polydipsia.  Genitourinary: Negative for decreased urine volume, dysuria and hematuria.  Musculoskeletal: Negative for arthralgias, back  pain and gait problem.  Skin: Negative for rash.  Allergic/Immunologic: Negative for environmental allergies.  Neurological: Negative for seizures, syncope, light-headedness and headaches.  Hematological: Negative for adenopathy.  Psychiatric/Behavioral: Negative for agitation, dysphoric mood and suicidal ideas. The patient is not nervous/anxious.     Per HPI unless specifically indicated above     Objective:    BP 120/86 (BP Location: Left Arm, Patient Position: Sitting, Cuff Size: Normal)   Pulse 73   Temp 97.9 F (36.6 C)   Ht 5\' 10"  (1.778 m)   Wt 217 lb (98.4 kg)   SpO2 99%   BMI 31.14 kg/m   Wt Readings from Last 3 Encounters:  12/07/16 217 lb (98.4 kg)  09/08/16 225 lb (102.1 kg)  07/11/16 224 lb (101.6 kg)    Physical Exam  Constitutional: He is oriented to person, place, and time. He appears well-developed and well-nourished.  HENT:  Head: Normocephalic and atraumatic.  Neck: Neck supple.  Cardiovascular: Normal rate and regular rhythm.   Pulmonary/Chest: Effort normal and breath sounds normal. He has no wheezes.  Abdominal: Soft. Bowel sounds are normal. There is no hepatosplenomegaly. There is no tenderness.  Musculoskeletal: He exhibits no edema.  Lymphadenopathy:    He has no cervical adenopathy.  Neurological: He is alert and oriented to person, place, and time.  Skin: Skin is warm and dry.  Psychiatric: He has a normal mood and affect. His behavior is normal.  Vitals reviewed.   Results for orders placed or performed in visit on 11/28/16  Comprehensive metabolic panel  Result Value Ref Range   Sodium 140 135 - 146 mmol/L   Potassium 4.4 3.5 - 5.3 mmol/L   Chloride 106 98 - 110 mmol/L   CO2 26 20 - 31 mmol/L   Glucose, Bld 93 65 - 99 mg/dL   BUN 11 7 - 25 mg/dL   Creat 0.88 0.70 - 1.25 mg/dL   Total Bilirubin 0.4 0.2 - 1.2 mg/dL   Alkaline Phosphatase 64 40 - 115 U/L   AST 18 10 - 35 U/L   ALT 34 9 - 46 U/L   Total Protein 7.5 6.1 - 8.1 g/dL    Albumin 4.2 3.6 - 5.1 g/dL   Calcium 9.2 8.6 - 10.3 mg/dL  Lipid panel  Result Value Ref Range   Cholesterol 144 <200 mg/dL   Triglycerides 104 <150 mg/dL   HDL 46 >40 mg/dL   Total CHOL/HDL Ratio 3.1 <5.0 Ratio   VLDL 21 <30 mg/dL   LDL Cholesterol 77 <100 mg/dL      Assessment & Plan:   Encounter Diagnoses  Name Primary?  . Essential hypertension Yes  . Hyperlipidemia, unspecified hyperlipidemia type   . Cigarette nicotine dependence without complication   . ETOH abuse      -reviewed labs with pt -counseled pt on reducing his alcohol intake.  Counseled to limit to no more than one 12 ounce beer/day -counseled smoking cessation -pt to continue current medications -follow up 3 months.  RTO sooner prn

## 2017-02-26 ENCOUNTER — Other Ambulatory Visit: Payer: Self-pay | Admitting: Physician Assistant

## 2017-03-01 ENCOUNTER — Other Ambulatory Visit (HOSPITAL_COMMUNITY)
Admission: RE | Admit: 2017-03-01 | Discharge: 2017-03-01 | Disposition: A | Discharge status: Home / Self Care | Payer: Medicaid Other | Source: Ambulatory Visit | Attending: Physician Assistant | Admitting: Physician Assistant

## 2017-03-01 LAB — LIPID PANEL
CHOLESTEROL: 142 mg/dL (ref 0–200)
HDL: 42 mg/dL (ref >40)
LDL CALC: 70 mg/dL (ref 0–99)
TRIGLYCERIDES: 148 mg/dL (ref <150)
Total CHOL/HDL Ratio: 3.4 RATIO
VLDL: 30 mg/dL (ref 0–40)

## 2017-03-01 LAB — COMPREHENSIVE METABOLIC PANEL
ALK PHOS: 56 U/L (ref 38–126)
ALT: 33 U/L (ref 17–63)
ANION GAP: 8 (ref 5–15)
AST: 21 U/L (ref 15–41)
Albumin: 4.2 g/dL (ref 3.5–5.0)
BUN: 15 mg/dL (ref 6–20)
CALCIUM: 9.2 mg/dL (ref 8.9–10.3)
CO2: 24 mmol/L (ref 22–32)
CREATININE: 0.86 mg/dL (ref 0.61–1.24)
Chloride: 107 mmol/L (ref 101–111)
Glucose, Bld: 101 mg/dL — ABNORMAL HIGH (ref 65–99)
Potassium: 3.8 mmol/L (ref 3.5–5.1)
Sodium: 139 mmol/L (ref 135–145)
TOTAL PROTEIN: 7.8 g/dL (ref 6.5–8.1)
Total Bilirubin: 0.4 mg/dL (ref 0.3–1.2)

## 2017-03-02 ENCOUNTER — Other Ambulatory Visit: Payer: Self-pay | Admitting: Physician Assistant

## 2017-03-09 ENCOUNTER — Ambulatory Visit: Payer: Medicaid Other | Admitting: Physician Assistant

## 2017-03-09 ENCOUNTER — Encounter: Payer: Self-pay | Admitting: Physician Assistant

## 2017-03-09 VITALS — BP 136/74 | HR 81 | Temp 97.5°F | Ht 70.0 in | Wt 218.7 lb

## 2017-03-09 DIAGNOSIS — E785 Hyperlipidemia, unspecified: Secondary | ICD-10-CM

## 2017-03-09 DIAGNOSIS — I10 Essential (primary) hypertension: Secondary | ICD-10-CM

## 2017-03-09 DIAGNOSIS — F17210 Nicotine dependence, cigarettes, uncomplicated: Secondary | ICD-10-CM

## 2017-03-09 MED ORDER — ATORVASTATIN CALCIUM 80 MG PO TABS: 80.0000 mg | ORAL_TABLET | Freq: Every day | ORAL | 4 refills | Status: DC

## 2017-03-09 NOTE — Progress Notes (Signed)
BP 136/74 (BP Location: Left Arm, Patient Position: Sitting, Cuff Size: Normal)   Pulse 81   Temp 97.5 F (36.4 C) (Other (Comment))   Ht 5\' 10"  (1.778 m)   Wt 218 lb 11.2 oz (99.2 kg)   SpO2 96%   BMI 31.38 kg/m    Subjective:    Patient ID: Drew Cruz, male    DOB: 1956/08/31, 61 y.o.   MRN: [id number]  HPI: Drew Cruz is a 61 y.o. male presenting on 03/09/2017 for Hypertension and Hyperlipidemia   HPI   Pt is feeling well today and has no complaints.  Relevant past medical, surgical, family and social history reviewed and updated as indicated. Interim medical history since our last visit reviewed. Allergies and medications reviewed and updated.   Current Outpatient Prescriptions:  .  aspirin 325 MG tablet, Take 1 tablet (325 mg total) by mouth daily., Disp: 100 tablet, Rfl: 3 .  atorvastatin (LIPITOR) 20 MG tablet, Take 80 mg by mouth daily. , Disp: , Rfl: 11 .  lisinopril (PRINIVIL,ZESTRIL) 20 MG tablet, TAKE 1 TABLET (20 MG TOTAL) BY MOUTH DAILY., Disp: 30 tablet, Rfl: 3  Review of Systems  Constitutional: Negative for appetite change, chills, diaphoresis, fatigue, fever and unexpected weight change.  HENT: Negative for congestion, dental problem, drooling, ear pain, facial swelling, hearing loss, mouth sores, sneezing, sore throat, trouble swallowing and voice change.   Eyes: Negative for pain, discharge, redness, itching and visual disturbance.  Respiratory: Positive for cough and shortness of breath. Negative for choking and wheezing.   Cardiovascular: Negative for chest pain, palpitations and leg swelling.  Gastrointestinal: Negative for abdominal pain, blood in stool, constipation, diarrhea and vomiting.  Endocrine: Negative for cold intolerance, heat intolerance and polydipsia.  Genitourinary: Negative for decreased urine volume, dysuria and hematuria.  Musculoskeletal: Positive for gait problem. Negative for arthralgias and back pain.  Skin: Negative for rash.   Allergic/Immunologic: Negative for environmental allergies.  Neurological: Negative for seizures, syncope, light-headedness and headaches.  Hematological: Negative for adenopathy.  Psychiatric/Behavioral: Negative for agitation, dysphoric mood and suicidal ideas. The patient is not nervous/anxious.     Per HPI unless specifically indicated above     Objective:    BP 136/74 (BP Location: Left Arm, Patient Position: Sitting, Cuff Size: Normal)   Pulse 81   Temp 97.5 F (36.4 C) (Other (Comment))   Ht 5\' 10"  (1.778 m)   Wt 218 lb 11.2 oz (99.2 kg)   SpO2 96%   BMI 31.38 kg/m   Wt Readings from Last 3 Encounters:  03/09/17 218 lb 11.2 oz (99.2 kg)  12/07/16 217 lb (98.4 kg)  09/08/16 225 lb (102.1 kg)    Physical Exam  Constitutional: He is oriented to person, place, and time. He appears well-developed and well-nourished.  HENT:  Head: Normocephalic and atraumatic.  Neck: Neck supple.  Cardiovascular: Normal rate and regular rhythm.   Pulmonary/Chest: Effort normal and breath sounds normal. He has no wheezes.  Abdominal: Soft. Bowel sounds are normal. There is no hepatosplenomegaly. There is no tenderness.  Musculoskeletal: He exhibits no edema.  Lymphadenopathy:    He has no cervical adenopathy.  Neurological: He is alert and oriented to person, place, and time.  Skin: Skin is warm and dry.  Psychiatric: He has a normal mood and affect. His behavior is normal.  Vitals reviewed.   Results for orders placed or performed during the hospital encounter of 03/01/17  Comprehensive metabolic panel  Result Value Ref Range  Sodium 139 135 - 145 mmol/L   Potassium 3.8 3.5 - 5.1 mmol/L   Chloride 107 101 - 111 mmol/L   CO2 24 22 - 32 mmol/L   Glucose, Bld 101 (H) 65 - 99 mg/dL   BUN 15 6 - 20 mg/dL   Creatinine, Ser 0.86 0.61 - 1.24 mg/dL   Calcium 9.2 8.9 - 10.3 mg/dL   Total Protein 7.8 6.5 - 8.1 g/dL   Albumin 4.2 3.5 - 5.0 g/dL   AST 21 15 - 41 U/L   ALT 33 17 - 63 U/L    Alkaline Phosphatase 56 38 - 126 U/L   Total Bilirubin 0.4 0.3 - 1.2 mg/dL   GFR calc non Af Amer >60 >60 mL/min   GFR calc Af Amer >60 >60 mL/min   Anion gap 8 5 - 15  Lipid panel  Result Value Ref Range   Cholesterol 142 0 - 200 mg/dL   Triglycerides 148 <150 mg/dL   HDL 42 >40 mg/dL   Total CHOL/HDL Ratio 3.4 RATIO   VLDL 30 0 - 40 mg/dL   LDL Cholesterol 70 0 - 99 mg/dL      Assessment & Plan:    Encounter Diagnoses  Name Primary?  . Essential hypertension Yes  . Hyperlipidemia, unspecified hyperlipidemia type   . Cigarette nicotine dependence without complication     -reviewed labs with pt -pt to continue current medications -pt counseled on smoking cessation -pt to follow up in 37months. RTO sooner prn

## 2017-03-09 NOTE — Patient Instructions (Signed)
Steps to Quit Smoking Smoking tobacco can be bad for your health. It can also affect almost every organ in your body. Smoking puts you and people around you at risk for many serious long-lasting (chronic) diseases. Quitting smoking is hard, but it is one of the best things that you can do for your health. It is never too late to quit. What are the benefits of quitting smoking? When you quit smoking, you lower your risk for getting serious diseases and conditions. They can include:  Lung cancer or lung disease.  Heart disease.  Stroke.  Heart attack.  Not being able to have children (infertility).  Weak bones (osteoporosis) and broken bones (fractures).  If you have coughing, wheezing, and shortness of breath, those symptoms may get better when you quit. You may also get sick less often. If you are pregnant, quitting smoking can help to lower your chances of having a baby of low birth weight. What can I do to help me quit smoking? Talk with your doctor about what can help you quit smoking. Some things you can do (strategies) include:  Quitting smoking totally, instead of slowly cutting back how much you smoke over a period of time.  Going to in-person counseling. You are more likely to quit if you go to many counseling sessions.  Using resources and support systems, such as: ? Online chats with a counselor. ? Phone quitlines. ? Printed self-help materials. ? Support groups or group counseling. ? Text messaging programs. ? Mobile phone apps or applications.  Taking medicines. Some of these medicines may have nicotine in them. If you are pregnant or breastfeeding, do not take any medicines to quit smoking unless your doctor says it is okay. Talk with your doctor about counseling or other things that can help you.  Talk with your doctor about using more than one strategy at the same time, such as taking medicines while you are also going to in-person counseling. This can help make  quitting easier. What things can I do to make it easier to quit? Quitting smoking might feel very hard at first, but there is a lot that you can do to make it easier. Take these steps:  Talk to your family and friends. Ask them to support and encourage you.  Call phone quitlines, reach out to support groups, or work with a counselor.  Ask people who smoke to not smoke around you.  Avoid places that make you want (trigger) to smoke, such as: ? Bars. ? Parties. ? Smoke-break areas at work.  Spend time with people who do not smoke.  Lower the stress in your life. Stress can make you want to smoke. Try these things to help your stress: ? Getting regular exercise. ? Deep-breathing exercises. ? Yoga. ? Meditating. ? Doing a body scan. To do this, close your eyes, focus on one area of your body at a time from head to toe, and notice which parts of your body are tense. Try to relax the muscles in those areas.  Download or buy apps on your mobile phone or tablet that can help you stick to your quit plan. There are many free apps, such as QuitGuide from the CDC (Centers for Disease Control and Prevention). You can find more support from smokefree.gov and other websites.  This information is not intended to replace advice given to you by your health care provider. Make sure you discuss any questions you have with your health care provider. Document Released: 07/16/2009 Document   Revised: 05/17/2016 Document Reviewed: 02/03/2015 Elsevier Interactive Patient Education  2018 [employer]  

## 2017-06-07 ENCOUNTER — Ambulatory Visit: Payer: Medicaid Other | Admitting: Physician Assistant

## 2017-06-09 ENCOUNTER — Encounter: Payer: Self-pay | Admitting: Family Medicine

## 2017-06-09 ENCOUNTER — Ambulatory Visit (INDEPENDENT_AMBULATORY_CARE_PROVIDER_SITE_OTHER): Payer: Medicaid Other | Admitting: Family Medicine

## 2017-06-09 VITALS — BP 136/82 | HR 70 | Temp 97.5°F | Resp 16 | Ht 70.0 in | Wt 216.0 lb

## 2017-06-09 DIAGNOSIS — Z9189 Other specified personal risk factors, not elsewhere classified: Secondary | ICD-10-CM

## 2017-06-09 DIAGNOSIS — Z8673 Personal history of transient ischemic attack (TIA), and cerebral infarction without residual deficits: Secondary | ICD-10-CM | POA: Diagnosis not present

## 2017-06-09 DIAGNOSIS — Z122 Encounter for screening for malignant neoplasm of respiratory organs: Secondary | ICD-10-CM

## 2017-06-09 DIAGNOSIS — F17210 Nicotine dependence, cigarettes, uncomplicated: Secondary | ICD-10-CM

## 2017-06-09 DIAGNOSIS — Z1159 Encounter for screening for other viral diseases: Secondary | ICD-10-CM

## 2017-06-09 DIAGNOSIS — Z23 Encounter for immunization: Secondary | ICD-10-CM

## 2017-06-09 DIAGNOSIS — I6523 Occlusion and stenosis of bilateral carotid arteries: Secondary | ICD-10-CM | POA: Diagnosis not present

## 2017-06-09 MED ORDER — ATORVASTATIN CALCIUM 80 MG PO TABS: 80.0000 mg | ORAL_TABLET | Freq: Every day | ORAL | 3 refills | Status: DC

## 2017-06-09 MED ORDER — LISINOPRIL 20 MG PO TABS: 20.0000 mg | ORAL_TABLET | Freq: Every day | ORAL | 3 refills | Status: DC

## 2017-06-09 NOTE — Patient Instructions (Signed)
Try to reduce beer drinking to 3 or less a day Try to reduce or stop smoking Walk every day that you are able Shots today Need chest x ray for cancer screening  Get labs in November and see me next week

## 2017-06-09 NOTE — Progress Notes (Signed)
Chief Complaint  Patient presents with  . Hypertension  new patient BP well controlled On maximum lipitor LDL checked 3 months ago - 70 On no diet, no reg exercise I have discussed the multiple health risks associated with cigarette smoking including, but not limited to, cardiovascular disease, lung disease and cancer.  I have strongly recommended that smoking be stopped.  I have reviewed the various methods of quitting including cold [place], classes, nicotine replacements and prescription medications.  I have offered assistance in this difficult process.  The patient is not interested in assistance at this time.  Discussed increased stroke risk Drinks excessive alcohol daily, for many years.  No health issues related to alcohol , liver disease or cirrhosis.  He does not drive.  Denies DUI or legal issues. Has had more than one stroke with no sequelae to date.  We discussed stroke prevention - BP, lipids, smoking, ASA.     Patient Active Problem List   Diagnosis Date Noted  . Carotid atherosclerosis, bilateral 06/09/2017  . ETOH abuse 10/29/2015  . CVA (cerebral infarction) 04/09/2015  . Hyperlipidemia 04/09/2015  . HTN (hypertension) 04/09/2015  . Tobacco abuse 04/09/2015  . Acute CVA (cerebrovascular accident) ([place]) 04/09/2015    Outpatient Encounter Prescriptions as of 06/09/2017  Medication Sig  . aspirin 325 MG tablet Take 1 tablet (325 mg total) by mouth daily.  Marland Kitchen atorvastatin (LIPITOR) 80 MG tablet Take 1 tablet (80 mg total) by mouth daily.  Marland Kitchen lisinopril (PRINIVIL,ZESTRIL) 20 MG tablet Take 1 tablet (20 mg total) by mouth daily.   No facility-administered encounter medications on file as of 06/09/2017.     No Known Allergies  Review of Systems    BP 136/82 (BP Location: Right Arm, Patient Position: Sitting, Cuff Size: Large)   Pulse 70   Temp (!) 97.5 F (36.4 C) (Temporal)   Resp 16   Ht 5\' 10"  (1.778 m)   Wt 216 lb 0.6 oz (98 kg)   SpO2 97%   BMI 31.00  kg/m   Physical Exam  Constitutional: He appears well-developed and well-nourished. No distress.  malodorous  HENT:  Head: Normocephalic and atraumatic.  Right Ear: External ear normal.  Left Ear: External ear normal.  Mouth/Throat: Oropharynx is clear and moist.  Cerumen bilat.  Teeth poor repair  Eyes: Pupils are equal, round, and reactive to light. EOM are normal.  Neck: Normal range of motion. No thyromegaly present.  Cardiovascular: Normal rate, regular rhythm and normal heart sounds.   Pulmonary/Chest: Effort normal and breath sounds normal. No respiratory distress. He has no wheezes.  Abdominal: Soft. Bowel sounds are normal. He exhibits no mass. There is no tenderness.  NO hepatosplenomegaly  Musculoskeletal: Normal range of motion. He exhibits no edema.  Lymphadenopathy:    He has no cervical adenopathy.  Neurological: He is alert. Coordination normal.  Skin: Skin is warm and dry.  Psychiatric: He has a normal mood and affect. His behavior is normal. Thought content normal.  Slow responses.  Poor knowledge base    ASSESSMENT/PLAN:  1. Need for influenza vaccination done  2. Smokes with greater than 30 pack year history  3. Encounter for screening for malignant neoplasm of respiratory organs - CT CHEST LUNG CA SCREEN LOW DOSE W/O CM; Future  4. Encounter for hepatitis C virus screening test for high risk patient - Hepatitis C antibody  5. History of completed stroke - CBC - COMPLETE METABOLIC PANEL WITH GFR - Lipid panel - Urinalysis, Routine  w reflex microscopic - VITAMIN D 25 Hydroxy (Vit-D Deficiency, Fractures)  6. Carotid atherosclerosis, bilateral  Greater than 50% of this visit was spent in counseling and coordinating care.  Total face to face time:  45 min reviewing healthy lifestyle changes focusing on alcohol an d smoking   Patient Instructions  Try to reduce beer drinking to 3 or less a day Try to reduce or stop smoking Walk every day that you  are able Shots today Need chest x ray for cancer screening  Get labs in November and see me the next week   Raylene Everts, MD

## 2017-06-19 ENCOUNTER — Telehealth: Payer: Self-pay | Admitting: Family Medicine

## 2017-06-19 NOTE — Telephone Encounter (Signed)
Calling wanting to know when the appt for Radiology will be

## 2017-06-22 NOTE — Telephone Encounter (Signed)
appt made [phone]  @ 1400, pt aware.

## 2017-06-29 ENCOUNTER — Ambulatory Visit (HOSPITAL_COMMUNITY): Payer: Medicaid Other

## 2017-07-03 ENCOUNTER — Ambulatory Visit (HOSPITAL_COMMUNITY)
Admission: RE | Admit: 2017-07-03 | Discharge: 2017-07-03 | Disposition: A | Discharge status: Home / Self Care | Payer: Medicaid Other | Source: Ambulatory Visit | Attending: Family Medicine | Admitting: Family Medicine

## 2017-07-03 DIAGNOSIS — I700 Atherosclerosis of aorta: Secondary | ICD-10-CM | POA: Insufficient documentation

## 2017-07-03 DIAGNOSIS — Z122 Encounter for screening for malignant neoplasm of respiratory organs: Secondary | ICD-10-CM | POA: Diagnosis present

## 2017-07-03 DIAGNOSIS — J439 Emphysema, unspecified: Secondary | ICD-10-CM | POA: Diagnosis not present

## 2017-07-04 ENCOUNTER — Encounter: Payer: Self-pay | Admitting: Family Medicine

## 2017-07-04 DIAGNOSIS — I700 Atherosclerosis of aorta: Secondary | ICD-10-CM | POA: Insufficient documentation

## 2017-07-04 DIAGNOSIS — J439 Emphysema, unspecified: Secondary | ICD-10-CM

## 2017-07-04 HISTORY — DX: Emphysema, unspecified: J43.9

## 2017-08-11 ENCOUNTER — Encounter: Payer: Self-pay | Admitting: Family Medicine

## 2017-08-11 ENCOUNTER — Other Ambulatory Visit: Payer: Self-pay

## 2017-08-11 ENCOUNTER — Ambulatory Visit (INDEPENDENT_AMBULATORY_CARE_PROVIDER_SITE_OTHER): Payer: Medicaid Other | Admitting: Family Medicine

## 2017-08-11 VITALS — BP 134/80 | HR 84 | Temp 98.8°F | Resp 18 | Ht 70.0 in | Wt 221.0 lb

## 2017-08-11 DIAGNOSIS — I700 Atherosclerosis of aorta: Secondary | ICD-10-CM | POA: Diagnosis not present

## 2017-08-11 DIAGNOSIS — E785 Hyperlipidemia, unspecified: Secondary | ICD-10-CM

## 2017-08-11 DIAGNOSIS — I10 Essential (primary) hypertension: Secondary | ICD-10-CM | POA: Diagnosis not present

## 2017-08-11 DIAGNOSIS — Z720 Tobacco use: Secondary | ICD-10-CM | POA: Diagnosis not present

## 2017-08-11 NOTE — Progress Notes (Signed)
Chief Complaint  Patient presents with  . Hypertension  Patient is here for routine follow-up.  He brings with him his medications.  He is compliant with taking his atorvastatin 80 mg a day and lisinopril 20 mg a day.  He also is taking an aspirin a day.  His blood pressure is well controlled.  He voices no complaints. He continues to smoke cigarettes.  I discussed with him the importance of smoking cessation.  I offered him assistance in helping him quit.  He states that he is not ready to do this.  He states that he is smoking between 1-2 packs of cigarettes a day.  I did remind him that this increases his risk of having another stroke. He did have his chest CT to screen for lung cancer.  There is no lung cancer.  He does have emphysema.  He has current aortic and carotid atherosclerosis.  We discussed these results, the importance of staying on medicines, and again the importance of trying to quit smoking. He does not get very much exercise.  He spends most of his time in his apartment.  He is advised to try to take a walk every day.   Patient Active Problem List   Diagnosis Date Noted  . Pulmonary emphysema determined by X-ray ([place]) 07/04/2017  . Aortic atherosclerosis ([place]) 07/04/2017  . Carotid atherosclerosis, bilateral 06/09/2017  . ETOH abuse 10/29/2015  . CVA (cerebral infarction) 04/09/2015  . Hyperlipidemia 04/09/2015  . HTN (hypertension) 04/09/2015  . Tobacco abuse 04/09/2015  . Acute CVA (cerebrovascular accident) ([place]) 04/09/2015    Outpatient Encounter Medications as of 08/11/2017  Medication Sig  . aspirin 325 MG tablet Take 1 tablet (325 mg total) by mouth daily.  Marland Kitchen atorvastatin (LIPITOR) 80 MG tablet Take 1 tablet (80 mg total) by mouth daily.  Marland Kitchen lisinopril (PRINIVIL,ZESTRIL) 20 MG tablet Take 1 tablet (20 mg total) by mouth daily.   No facility-administered encounter medications on file as of 08/11/2017.     No Known Allergies  Review of Systems    Constitutional: Negative for activity change and unexpected weight change.  HENT: Negative for congestion.   Eyes: Negative for photophobia and visual disturbance.  Respiratory: Negative for cough and shortness of breath.   Cardiovascular: Negative for chest pain and leg swelling.  Gastrointestinal: Negative for blood in stool, constipation and diarrhea.  Genitourinary: Negative for difficulty urinating and frequency.  Musculoskeletal: Negative for arthralgias, back pain and gait problem.  Neurological: Negative for dizziness and headaches.  Psychiatric/Behavioral: Negative for sleep disturbance. The patient is not nervous/anxious.    Patient voices no acute complaints today.   BP 134/80 (BP Location: Right Arm, Patient Position: Sitting, Cuff Size: Large)   Pulse 84   Temp 98.8 F (37.1 C) (Temporal)   Resp 18   Ht 5\' 10"  (1.778 m)   Wt 221 lb (100.2 kg)   SpO2 98%   BMI 31.71 kg/m   Physical Exam  Constitutional: He is oriented to person, place, and time. He appears well-developed and well-nourished.  Overweight.  Smells of tobacco.  Polite and cooperative  HENT:  Head: Normocephalic and atraumatic.  Mouth/Throat: Oropharynx is clear and moist.  Eyes: Conjunctivae are normal. Pupils are equal, round, and reactive to light.  Neck: Normal range of motion. Neck supple. No thyromegaly present.  Cardiovascular: Normal rate, regular rhythm and normal heart sounds.  Pulmonary/Chest: Effort normal and breath sounds normal. No respiratory distress.  Abdominal: Soft. Bowel sounds are normal.  Musculoskeletal: Normal range of motion. He exhibits no edema.  Lymphadenopathy:    He has no cervical adenopathy.  Neurological: He is alert and oriented to person, place, and time.  Gait normal  Skin: Skin is warm and dry.  Psychiatric: He has a normal mood and affect. His behavior is normal. Thought content normal.  Nursing note and vitals reviewed.   ASSESSMENT/PLAN:  1.  Hyperlipidemia, unspecified hyperlipidemia type Compliant with Lipitor.  Lipid panel to be drawn today  2. Tobacco abuse Discussed with patient.  Recommendations for quitting.  Offered assistance.  3. Essential hypertension Well-controlled  4. Aortic atherosclerosis (HCC) Seen on CAT scan.  Results discussed with patient.   Patient Instructions  NEED LAB TESTS TODAY  See me in six months Call sooner for problems  Try to cut down on the smoking   Raylene Everts, MD

## 2017-08-11 NOTE — Patient Instructions (Signed)
NEED LAB TESTS TODAY  See me in six months Call sooner for problems  Try to cut down on the smoking

## 2017-12-11 ENCOUNTER — Encounter: Payer: Self-pay | Admitting: Family Medicine

## 2018-02-08 ENCOUNTER — Ambulatory Visit: Payer: Medicaid Other | Admitting: Family Medicine

## 2018-04-02 DIAGNOSIS — H269 Unspecified cataract: Secondary | ICD-10-CM | POA: Diagnosis not present

## 2018-04-03 DIAGNOSIS — H269 Unspecified cataract: Secondary | ICD-10-CM | POA: Diagnosis not present

## 2018-04-04 DIAGNOSIS — H269 Unspecified cataract: Secondary | ICD-10-CM | POA: Diagnosis not present

## 2018-04-05 DIAGNOSIS — H269 Unspecified cataract: Secondary | ICD-10-CM | POA: Diagnosis not present

## 2018-04-06 DIAGNOSIS — H269 Unspecified cataract: Secondary | ICD-10-CM | POA: Diagnosis not present

## 2018-04-07 DIAGNOSIS — H269 Unspecified cataract: Secondary | ICD-10-CM | POA: Diagnosis not present

## 2018-04-08 DIAGNOSIS — H269 Unspecified cataract: Secondary | ICD-10-CM | POA: Diagnosis not present

## 2018-04-09 DIAGNOSIS — H269 Unspecified cataract: Secondary | ICD-10-CM | POA: Diagnosis not present

## 2018-04-10 DIAGNOSIS — H269 Unspecified cataract: Secondary | ICD-10-CM | POA: Diagnosis not present

## 2018-04-11 DIAGNOSIS — H269 Unspecified cataract: Secondary | ICD-10-CM | POA: Diagnosis not present

## 2018-04-12 DIAGNOSIS — H269 Unspecified cataract: Secondary | ICD-10-CM | POA: Diagnosis not present

## 2018-04-13 DIAGNOSIS — H269 Unspecified cataract: Secondary | ICD-10-CM | POA: Diagnosis not present

## 2018-04-14 DIAGNOSIS — H269 Unspecified cataract: Secondary | ICD-10-CM | POA: Diagnosis not present

## 2018-04-15 DIAGNOSIS — H269 Unspecified cataract: Secondary | ICD-10-CM | POA: Diagnosis not present

## 2018-04-16 DIAGNOSIS — H269 Unspecified cataract: Secondary | ICD-10-CM | POA: Diagnosis not present

## 2018-04-17 DIAGNOSIS — H269 Unspecified cataract: Secondary | ICD-10-CM | POA: Diagnosis not present

## 2018-04-18 DIAGNOSIS — H269 Unspecified cataract: Secondary | ICD-10-CM | POA: Diagnosis not present

## 2018-04-19 DIAGNOSIS — H269 Unspecified cataract: Secondary | ICD-10-CM | POA: Diagnosis not present

## 2018-04-20 DIAGNOSIS — H269 Unspecified cataract: Secondary | ICD-10-CM | POA: Diagnosis not present

## 2018-04-21 DIAGNOSIS — H269 Unspecified cataract: Secondary | ICD-10-CM | POA: Diagnosis not present

## 2018-04-22 DIAGNOSIS — H269 Unspecified cataract: Secondary | ICD-10-CM | POA: Diagnosis not present

## 2018-04-23 DIAGNOSIS — H269 Unspecified cataract: Secondary | ICD-10-CM | POA: Diagnosis not present

## 2018-04-24 DIAGNOSIS — E559 Vitamin D deficiency, unspecified: Secondary | ICD-10-CM | POA: Diagnosis not present

## 2018-04-24 DIAGNOSIS — I10 Essential (primary) hypertension: Secondary | ICD-10-CM | POA: Diagnosis not present

## 2018-04-24 DIAGNOSIS — E785 Hyperlipidemia, unspecified: Secondary | ICD-10-CM | POA: Diagnosis not present

## 2018-04-24 DIAGNOSIS — H269 Unspecified cataract: Secondary | ICD-10-CM | POA: Diagnosis not present

## 2018-04-25 DIAGNOSIS — H269 Unspecified cataract: Secondary | ICD-10-CM | POA: Diagnosis not present

## 2018-04-26 DIAGNOSIS — H269 Unspecified cataract: Secondary | ICD-10-CM | POA: Diagnosis not present

## 2018-04-27 DIAGNOSIS — H269 Unspecified cataract: Secondary | ICD-10-CM | POA: Diagnosis not present

## 2018-04-28 DIAGNOSIS — H269 Unspecified cataract: Secondary | ICD-10-CM | POA: Diagnosis not present

## 2018-04-29 DIAGNOSIS — H269 Unspecified cataract: Secondary | ICD-10-CM | POA: Diagnosis not present

## 2018-04-30 DIAGNOSIS — H269 Unspecified cataract: Secondary | ICD-10-CM | POA: Diagnosis not present

## 2018-05-01 DIAGNOSIS — H269 Unspecified cataract: Secondary | ICD-10-CM | POA: Diagnosis not present

## 2018-05-02 DIAGNOSIS — H269 Unspecified cataract: Secondary | ICD-10-CM | POA: Diagnosis not present

## 2018-05-03 DIAGNOSIS — H269 Unspecified cataract: Secondary | ICD-10-CM | POA: Diagnosis not present

## 2018-05-04 DIAGNOSIS — H269 Unspecified cataract: Secondary | ICD-10-CM | POA: Diagnosis not present

## 2018-05-05 DIAGNOSIS — H269 Unspecified cataract: Secondary | ICD-10-CM | POA: Diagnosis not present

## 2018-05-06 DIAGNOSIS — H269 Unspecified cataract: Secondary | ICD-10-CM | POA: Diagnosis not present

## 2018-05-07 ENCOUNTER — Other Ambulatory Visit: Payer: Self-pay | Admitting: Physician Assistant

## 2018-05-07 ENCOUNTER — Other Ambulatory Visit: Payer: Self-pay | Admitting: Family Medicine

## 2018-05-07 DIAGNOSIS — H269 Unspecified cataract: Secondary | ICD-10-CM | POA: Diagnosis not present

## 2018-05-08 DIAGNOSIS — H269 Unspecified cataract: Secondary | ICD-10-CM | POA: Diagnosis not present

## 2018-05-09 DIAGNOSIS — H269 Unspecified cataract: Secondary | ICD-10-CM | POA: Diagnosis not present

## 2018-05-10 DIAGNOSIS — H269 Unspecified cataract: Secondary | ICD-10-CM | POA: Diagnosis not present

## 2018-05-11 DIAGNOSIS — H269 Unspecified cataract: Secondary | ICD-10-CM | POA: Diagnosis not present

## 2018-05-12 DIAGNOSIS — H269 Unspecified cataract: Secondary | ICD-10-CM | POA: Diagnosis not present

## 2018-05-13 DIAGNOSIS — H269 Unspecified cataract: Secondary | ICD-10-CM | POA: Diagnosis not present

## 2018-05-14 DIAGNOSIS — H269 Unspecified cataract: Secondary | ICD-10-CM | POA: Diagnosis not present

## 2018-05-15 DIAGNOSIS — H269 Unspecified cataract: Secondary | ICD-10-CM | POA: Diagnosis not present

## 2018-05-16 DIAGNOSIS — H269 Unspecified cataract: Secondary | ICD-10-CM | POA: Diagnosis not present

## 2018-05-17 DIAGNOSIS — H269 Unspecified cataract: Secondary | ICD-10-CM | POA: Diagnosis not present

## 2018-05-18 DIAGNOSIS — H269 Unspecified cataract: Secondary | ICD-10-CM | POA: Diagnosis not present

## 2018-05-19 DIAGNOSIS — H269 Unspecified cataract: Secondary | ICD-10-CM | POA: Diagnosis not present

## 2018-05-20 DIAGNOSIS — H269 Unspecified cataract: Secondary | ICD-10-CM | POA: Diagnosis not present

## 2018-05-21 DIAGNOSIS — H269 Unspecified cataract: Secondary | ICD-10-CM | POA: Diagnosis not present

## 2018-05-22 DIAGNOSIS — H269 Unspecified cataract: Secondary | ICD-10-CM | POA: Diagnosis not present

## 2018-05-23 DIAGNOSIS — H269 Unspecified cataract: Secondary | ICD-10-CM | POA: Diagnosis not present

## 2018-05-24 DIAGNOSIS — H269 Unspecified cataract: Secondary | ICD-10-CM | POA: Diagnosis not present

## 2018-05-25 DIAGNOSIS — H269 Unspecified cataract: Secondary | ICD-10-CM | POA: Diagnosis not present

## 2018-05-26 DIAGNOSIS — H269 Unspecified cataract: Secondary | ICD-10-CM | POA: Diagnosis not present

## 2018-05-27 DIAGNOSIS — H269 Unspecified cataract: Secondary | ICD-10-CM | POA: Diagnosis not present

## 2018-05-28 DIAGNOSIS — H269 Unspecified cataract: Secondary | ICD-10-CM | POA: Diagnosis not present

## 2018-05-29 DIAGNOSIS — H269 Unspecified cataract: Secondary | ICD-10-CM | POA: Diagnosis not present

## 2018-05-30 DIAGNOSIS — H269 Unspecified cataract: Secondary | ICD-10-CM | POA: Diagnosis not present

## 2018-05-31 DIAGNOSIS — E663 Overweight: Secondary | ICD-10-CM | POA: Diagnosis not present

## 2018-05-31 DIAGNOSIS — E559 Vitamin D deficiency, unspecified: Secondary | ICD-10-CM | POA: Diagnosis not present

## 2018-05-31 DIAGNOSIS — H269 Unspecified cataract: Secondary | ICD-10-CM | POA: Diagnosis not present

## 2018-05-31 IMAGING — DX DG CHEST 2V
2 series · 2 of 2 positions shown · non-contrast
Comparison: No prior.

CLINICAL DATA: Nonproductive cough.

EXAM:
CHEST  2 VIEW

[chest pa]
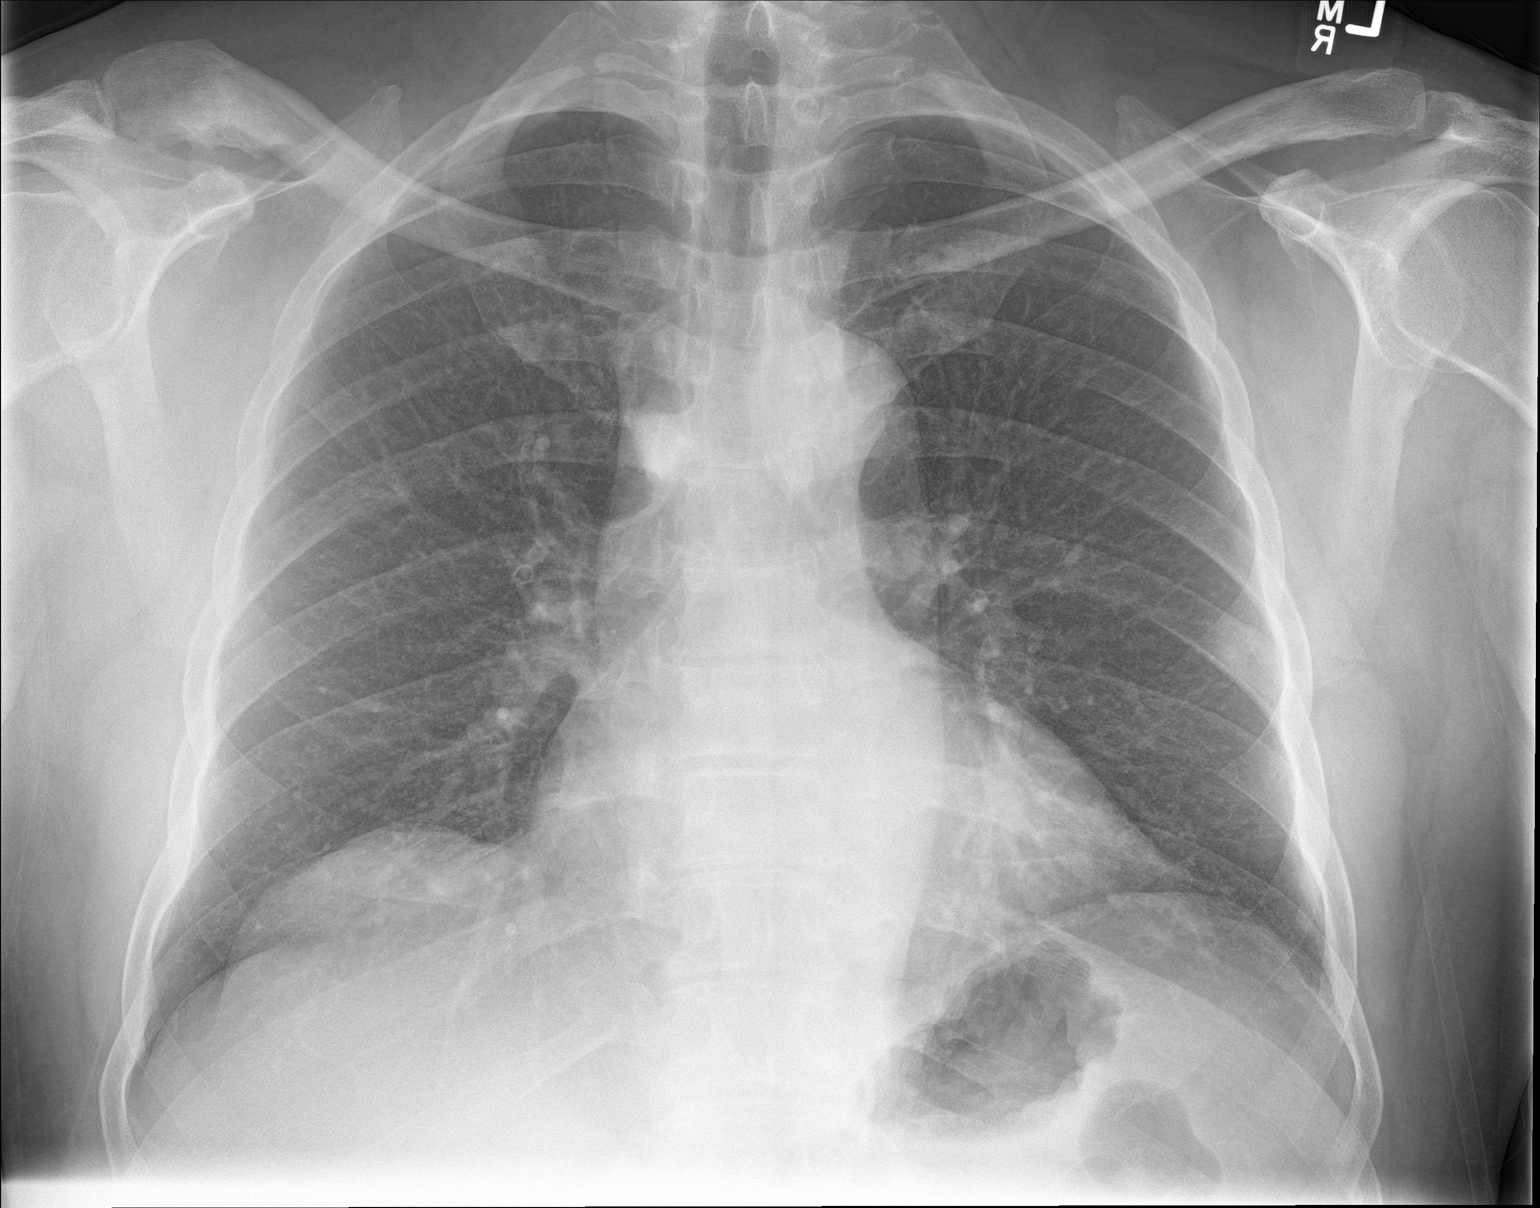

[chest lat]
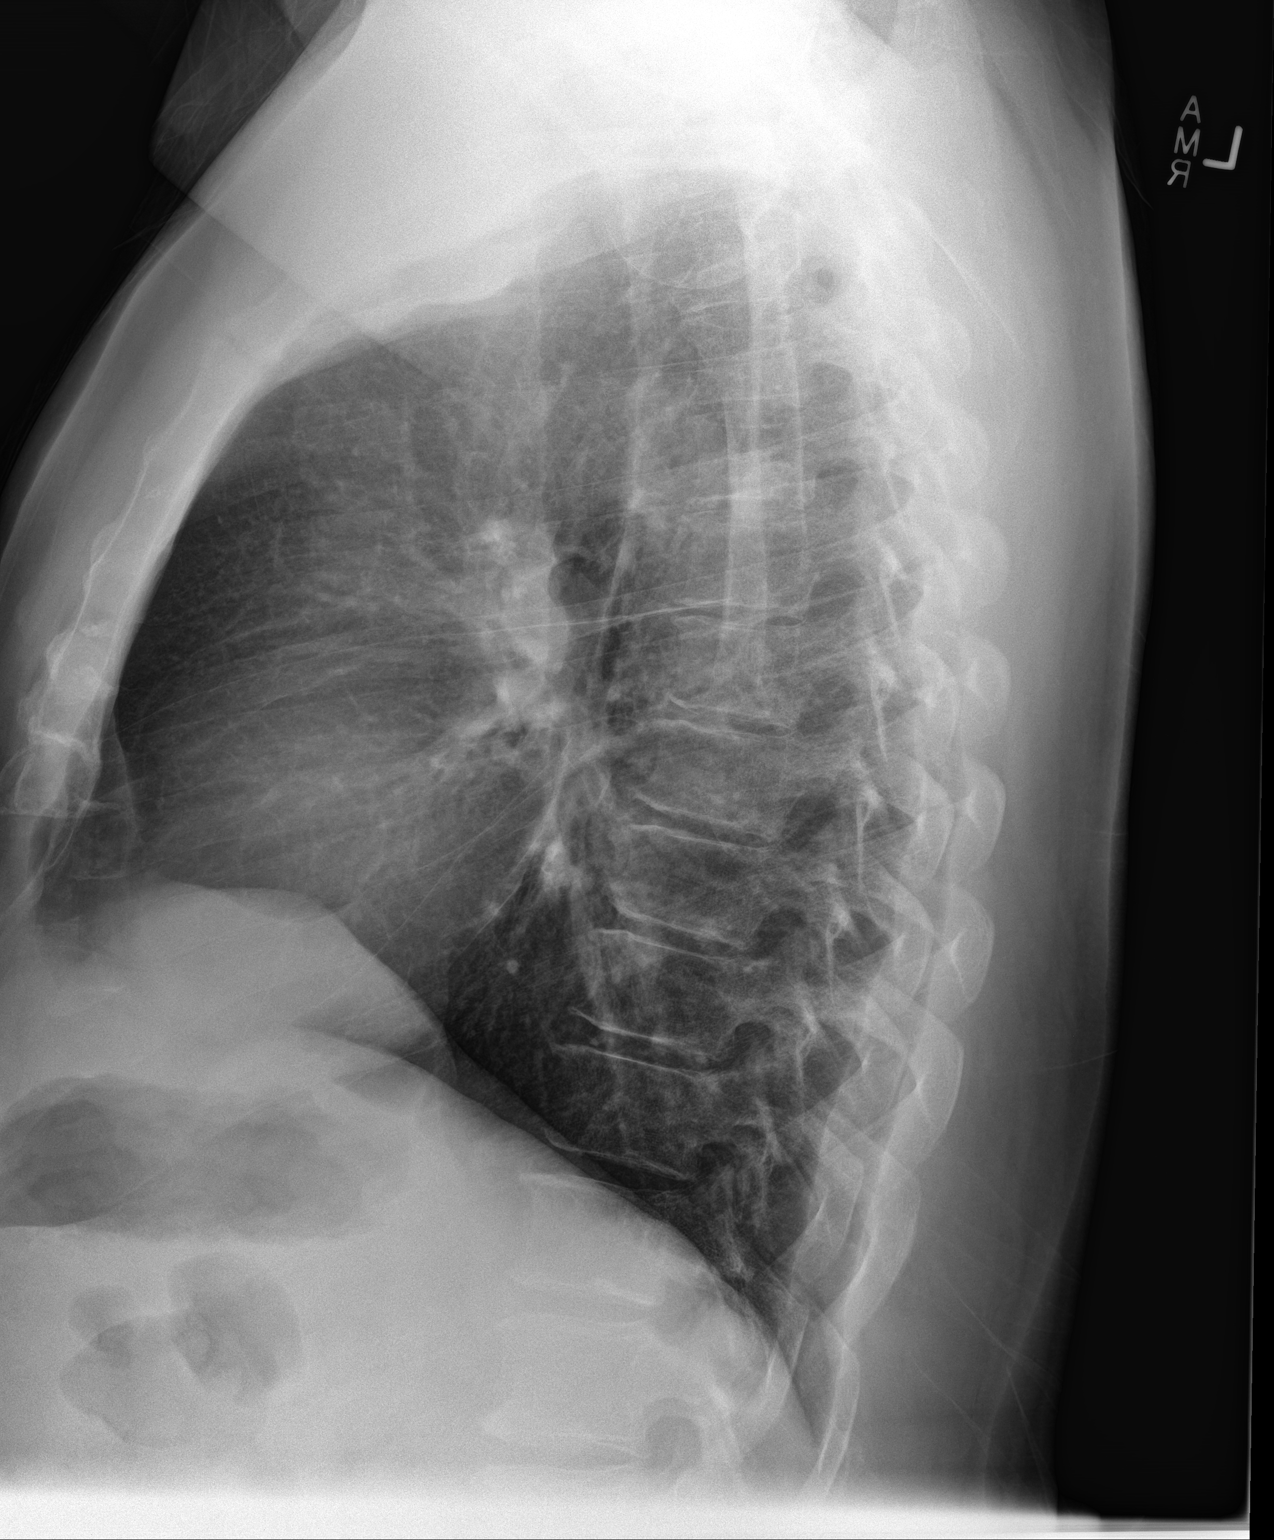

[2 of 2 positions shown; findings below may reference images not displayed]

FINDINGS: Mediastinum and hilar structures are normal. Borderline cardiomegaly
with normal pulmonary vascularity. Prominent upper thoracic spine
osteophytes. No focal alveolar infiltrate. No pleural effusion or
pneumothorax. Prominent degenerative changes both shoulders.
Increased density density noted projected over the left posterior
seventh rib with associated slight rib deformity, most likely callus
formation from old rib fracture.
IMPRESSION: 1. Borderline cardiomegaly, no pulmonary venous congestion. No acute
pulmonary disease.

2. Increased density projected over the left posterior seventh rib
with associated slight rib deformity. This is most likely from
callus formation from old rib fracture. Left rib series can be
obtained for further evaluation.

## 2018-06-01 DIAGNOSIS — H269 Unspecified cataract: Secondary | ICD-10-CM | POA: Diagnosis not present

## 2018-06-02 DIAGNOSIS — H269 Unspecified cataract: Secondary | ICD-10-CM | POA: Diagnosis not present

## 2018-06-03 DIAGNOSIS — H269 Unspecified cataract: Secondary | ICD-10-CM | POA: Diagnosis not present

## 2018-06-04 DIAGNOSIS — H269 Unspecified cataract: Secondary | ICD-10-CM | POA: Diagnosis not present

## 2018-06-05 DIAGNOSIS — H269 Unspecified cataract: Secondary | ICD-10-CM | POA: Diagnosis not present

## 2018-06-06 DIAGNOSIS — H269 Unspecified cataract: Secondary | ICD-10-CM | POA: Diagnosis not present

## 2018-06-07 DIAGNOSIS — H269 Unspecified cataract: Secondary | ICD-10-CM | POA: Diagnosis not present

## 2018-06-08 DIAGNOSIS — H269 Unspecified cataract: Secondary | ICD-10-CM | POA: Diagnosis not present

## 2018-06-09 DIAGNOSIS — H269 Unspecified cataract: Secondary | ICD-10-CM | POA: Diagnosis not present

## 2018-06-10 DIAGNOSIS — H269 Unspecified cataract: Secondary | ICD-10-CM | POA: Diagnosis not present

## 2018-06-11 DIAGNOSIS — H269 Unspecified cataract: Secondary | ICD-10-CM | POA: Diagnosis not present

## 2018-06-12 DIAGNOSIS — H269 Unspecified cataract: Secondary | ICD-10-CM | POA: Diagnosis not present

## 2018-06-13 DIAGNOSIS — H269 Unspecified cataract: Secondary | ICD-10-CM | POA: Diagnosis not present

## 2018-06-14 DIAGNOSIS — H269 Unspecified cataract: Secondary | ICD-10-CM | POA: Diagnosis not present

## 2018-06-15 DIAGNOSIS — H269 Unspecified cataract: Secondary | ICD-10-CM | POA: Diagnosis not present

## 2018-06-16 DIAGNOSIS — H269 Unspecified cataract: Secondary | ICD-10-CM | POA: Diagnosis not present

## 2018-06-17 DIAGNOSIS — H269 Unspecified cataract: Secondary | ICD-10-CM | POA: Diagnosis not present

## 2018-06-18 DIAGNOSIS — H269 Unspecified cataract: Secondary | ICD-10-CM | POA: Diagnosis not present

## 2018-06-19 DIAGNOSIS — H269 Unspecified cataract: Secondary | ICD-10-CM | POA: Diagnosis not present

## 2018-06-20 DIAGNOSIS — H269 Unspecified cataract: Secondary | ICD-10-CM | POA: Diagnosis not present

## 2018-06-21 DIAGNOSIS — H269 Unspecified cataract: Secondary | ICD-10-CM | POA: Diagnosis not present

## 2018-06-22 DIAGNOSIS — H269 Unspecified cataract: Secondary | ICD-10-CM | POA: Diagnosis not present

## 2018-06-23 DIAGNOSIS — H269 Unspecified cataract: Secondary | ICD-10-CM | POA: Diagnosis not present

## 2018-06-24 DIAGNOSIS — H269 Unspecified cataract: Secondary | ICD-10-CM | POA: Diagnosis not present

## 2018-06-25 DIAGNOSIS — H269 Unspecified cataract: Secondary | ICD-10-CM | POA: Diagnosis not present

## 2018-06-26 DIAGNOSIS — H269 Unspecified cataract: Secondary | ICD-10-CM | POA: Diagnosis not present

## 2018-06-27 DIAGNOSIS — H269 Unspecified cataract: Secondary | ICD-10-CM | POA: Diagnosis not present

## 2018-06-28 DIAGNOSIS — H269 Unspecified cataract: Secondary | ICD-10-CM | POA: Diagnosis not present

## 2018-06-29 DIAGNOSIS — H269 Unspecified cataract: Secondary | ICD-10-CM | POA: Diagnosis not present

## 2018-06-30 DIAGNOSIS — H269 Unspecified cataract: Secondary | ICD-10-CM | POA: Diagnosis not present

## 2018-07-01 DIAGNOSIS — H269 Unspecified cataract: Secondary | ICD-10-CM | POA: Diagnosis not present

## 2018-07-02 DIAGNOSIS — H269 Unspecified cataract: Secondary | ICD-10-CM | POA: Diagnosis not present

## 2018-07-03 DIAGNOSIS — H269 Unspecified cataract: Secondary | ICD-10-CM | POA: Diagnosis not present

## 2018-07-04 DIAGNOSIS — H269 Unspecified cataract: Secondary | ICD-10-CM | POA: Diagnosis not present

## 2018-07-05 DIAGNOSIS — H269 Unspecified cataract: Secondary | ICD-10-CM | POA: Diagnosis not present

## 2018-07-06 DIAGNOSIS — H269 Unspecified cataract: Secondary | ICD-10-CM | POA: Diagnosis not present

## 2018-07-07 DIAGNOSIS — H269 Unspecified cataract: Secondary | ICD-10-CM | POA: Diagnosis not present

## 2018-07-08 DIAGNOSIS — H269 Unspecified cataract: Secondary | ICD-10-CM | POA: Diagnosis not present

## 2018-07-09 DIAGNOSIS — H269 Unspecified cataract: Secondary | ICD-10-CM | POA: Diagnosis not present

## 2018-07-10 DIAGNOSIS — H269 Unspecified cataract: Secondary | ICD-10-CM | POA: Diagnosis not present

## 2018-07-11 DIAGNOSIS — H269 Unspecified cataract: Secondary | ICD-10-CM | POA: Diagnosis not present

## 2018-07-12 DIAGNOSIS — H269 Unspecified cataract: Secondary | ICD-10-CM | POA: Diagnosis not present

## 2018-07-13 DIAGNOSIS — H269 Unspecified cataract: Secondary | ICD-10-CM | POA: Diagnosis not present

## 2018-07-14 DIAGNOSIS — H269 Unspecified cataract: Secondary | ICD-10-CM | POA: Diagnosis not present

## 2018-07-15 DIAGNOSIS — H269 Unspecified cataract: Secondary | ICD-10-CM | POA: Diagnosis not present

## 2018-07-16 DIAGNOSIS — H269 Unspecified cataract: Secondary | ICD-10-CM | POA: Diagnosis not present

## 2018-07-17 DIAGNOSIS — H269 Unspecified cataract: Secondary | ICD-10-CM | POA: Diagnosis not present

## 2018-07-18 DIAGNOSIS — H269 Unspecified cataract: Secondary | ICD-10-CM | POA: Diagnosis not present

## 2018-07-19 DIAGNOSIS — H269 Unspecified cataract: Secondary | ICD-10-CM | POA: Diagnosis not present

## 2018-07-20 DIAGNOSIS — H269 Unspecified cataract: Secondary | ICD-10-CM | POA: Diagnosis not present

## 2018-07-21 DIAGNOSIS — H269 Unspecified cataract: Secondary | ICD-10-CM | POA: Diagnosis not present

## 2018-07-22 DIAGNOSIS — H269 Unspecified cataract: Secondary | ICD-10-CM | POA: Diagnosis not present

## 2018-07-23 DIAGNOSIS — H269 Unspecified cataract: Secondary | ICD-10-CM | POA: Diagnosis not present

## 2018-07-24 DIAGNOSIS — H269 Unspecified cataract: Secondary | ICD-10-CM | POA: Diagnosis not present

## 2018-07-25 DIAGNOSIS — H269 Unspecified cataract: Secondary | ICD-10-CM | POA: Diagnosis not present

## 2018-07-26 DIAGNOSIS — H269 Unspecified cataract: Secondary | ICD-10-CM | POA: Diagnosis not present

## 2018-07-27 DIAGNOSIS — H269 Unspecified cataract: Secondary | ICD-10-CM | POA: Diagnosis not present

## 2018-07-28 DIAGNOSIS — H269 Unspecified cataract: Secondary | ICD-10-CM | POA: Diagnosis not present

## 2018-07-29 DIAGNOSIS — H269 Unspecified cataract: Secondary | ICD-10-CM | POA: Diagnosis not present

## 2018-07-30 DIAGNOSIS — H269 Unspecified cataract: Secondary | ICD-10-CM | POA: Diagnosis not present

## 2018-07-31 DIAGNOSIS — H269 Unspecified cataract: Secondary | ICD-10-CM | POA: Diagnosis not present

## 2018-08-01 DIAGNOSIS — H269 Unspecified cataract: Secondary | ICD-10-CM | POA: Diagnosis not present

## 2018-08-02 DIAGNOSIS — H269 Unspecified cataract: Secondary | ICD-10-CM | POA: Diagnosis not present

## 2018-08-03 DIAGNOSIS — H269 Unspecified cataract: Secondary | ICD-10-CM | POA: Diagnosis not present

## 2018-08-04 DIAGNOSIS — H269 Unspecified cataract: Secondary | ICD-10-CM | POA: Diagnosis not present

## 2018-08-05 DIAGNOSIS — H269 Unspecified cataract: Secondary | ICD-10-CM | POA: Diagnosis not present

## 2018-08-06 DIAGNOSIS — H269 Unspecified cataract: Secondary | ICD-10-CM | POA: Diagnosis not present

## 2018-08-07 DIAGNOSIS — H269 Unspecified cataract: Secondary | ICD-10-CM | POA: Diagnosis not present

## 2018-08-08 DIAGNOSIS — H269 Unspecified cataract: Secondary | ICD-10-CM | POA: Diagnosis not present

## 2018-08-09 DIAGNOSIS — H269 Unspecified cataract: Secondary | ICD-10-CM | POA: Diagnosis not present

## 2018-08-10 DIAGNOSIS — H269 Unspecified cataract: Secondary | ICD-10-CM | POA: Diagnosis not present

## 2018-08-11 DIAGNOSIS — H269 Unspecified cataract: Secondary | ICD-10-CM | POA: Diagnosis not present

## 2018-08-12 DIAGNOSIS — H269 Unspecified cataract: Secondary | ICD-10-CM | POA: Diagnosis not present

## 2018-08-13 DIAGNOSIS — H269 Unspecified cataract: Secondary | ICD-10-CM | POA: Diagnosis not present

## 2018-08-14 DIAGNOSIS — H269 Unspecified cataract: Secondary | ICD-10-CM | POA: Diagnosis not present

## 2018-08-15 DIAGNOSIS — H269 Unspecified cataract: Secondary | ICD-10-CM | POA: Diagnosis not present

## 2018-08-16 DIAGNOSIS — H269 Unspecified cataract: Secondary | ICD-10-CM | POA: Diagnosis not present

## 2018-08-17 DIAGNOSIS — H269 Unspecified cataract: Secondary | ICD-10-CM | POA: Diagnosis not present

## 2018-08-18 DIAGNOSIS — H269 Unspecified cataract: Secondary | ICD-10-CM | POA: Diagnosis not present

## 2018-08-19 DIAGNOSIS — H269 Unspecified cataract: Secondary | ICD-10-CM | POA: Diagnosis not present

## 2018-08-20 DIAGNOSIS — H269 Unspecified cataract: Secondary | ICD-10-CM | POA: Diagnosis not present

## 2018-08-21 DIAGNOSIS — H269 Unspecified cataract: Secondary | ICD-10-CM | POA: Diagnosis not present

## 2018-08-22 DIAGNOSIS — H269 Unspecified cataract: Secondary | ICD-10-CM | POA: Diagnosis not present

## 2018-08-23 DIAGNOSIS — H269 Unspecified cataract: Secondary | ICD-10-CM | POA: Diagnosis not present

## 2018-08-24 DIAGNOSIS — H269 Unspecified cataract: Secondary | ICD-10-CM | POA: Diagnosis not present

## 2018-08-25 DIAGNOSIS — H269 Unspecified cataract: Secondary | ICD-10-CM | POA: Diagnosis not present

## 2018-08-26 DIAGNOSIS — H269 Unspecified cataract: Secondary | ICD-10-CM | POA: Diagnosis not present

## 2018-08-27 DIAGNOSIS — H269 Unspecified cataract: Secondary | ICD-10-CM | POA: Diagnosis not present

## 2018-08-28 DIAGNOSIS — H269 Unspecified cataract: Secondary | ICD-10-CM | POA: Diagnosis not present

## 2018-08-29 DIAGNOSIS — H269 Unspecified cataract: Secondary | ICD-10-CM | POA: Diagnosis not present

## 2018-08-29 DIAGNOSIS — I10 Essential (primary) hypertension: Secondary | ICD-10-CM | POA: Diagnosis not present

## 2018-08-29 DIAGNOSIS — E785 Hyperlipidemia, unspecified: Secondary | ICD-10-CM | POA: Diagnosis not present

## 2018-08-29 DIAGNOSIS — Z23 Encounter for immunization: Secondary | ICD-10-CM | POA: Diagnosis not present

## 2018-08-29 DIAGNOSIS — E559 Vitamin D deficiency, unspecified: Secondary | ICD-10-CM | POA: Diagnosis not present

## 2018-08-29 DIAGNOSIS — I6990 Unspecified sequelae of unspecified cerebrovascular disease: Secondary | ICD-10-CM | POA: Diagnosis not present

## 2018-08-30 DIAGNOSIS — H269 Unspecified cataract: Secondary | ICD-10-CM | POA: Diagnosis not present

## 2018-08-31 DIAGNOSIS — H269 Unspecified cataract: Secondary | ICD-10-CM | POA: Diagnosis not present

## 2018-09-01 DIAGNOSIS — H269 Unspecified cataract: Secondary | ICD-10-CM | POA: Diagnosis not present

## 2018-09-02 DIAGNOSIS — H269 Unspecified cataract: Secondary | ICD-10-CM | POA: Diagnosis not present

## 2018-09-03 DIAGNOSIS — H269 Unspecified cataract: Secondary | ICD-10-CM | POA: Diagnosis not present

## 2018-09-04 DIAGNOSIS — H269 Unspecified cataract: Secondary | ICD-10-CM | POA: Diagnosis not present

## 2018-09-05 DIAGNOSIS — H269 Unspecified cataract: Secondary | ICD-10-CM | POA: Diagnosis not present

## 2018-09-06 DIAGNOSIS — H269 Unspecified cataract: Secondary | ICD-10-CM | POA: Diagnosis not present

## 2018-09-07 DIAGNOSIS — H269 Unspecified cataract: Secondary | ICD-10-CM | POA: Diagnosis not present

## 2018-09-08 DIAGNOSIS — H269 Unspecified cataract: Secondary | ICD-10-CM | POA: Diagnosis not present

## 2018-09-09 DIAGNOSIS — H269 Unspecified cataract: Secondary | ICD-10-CM | POA: Diagnosis not present

## 2018-09-10 DIAGNOSIS — H269 Unspecified cataract: Secondary | ICD-10-CM | POA: Diagnosis not present

## 2018-09-11 DIAGNOSIS — H269 Unspecified cataract: Secondary | ICD-10-CM | POA: Diagnosis not present

## 2018-09-12 DIAGNOSIS — H269 Unspecified cataract: Secondary | ICD-10-CM | POA: Diagnosis not present

## 2018-09-13 DIAGNOSIS — H269 Unspecified cataract: Secondary | ICD-10-CM | POA: Diagnosis not present

## 2018-09-14 DIAGNOSIS — H269 Unspecified cataract: Secondary | ICD-10-CM | POA: Diagnosis not present

## 2018-09-15 DIAGNOSIS — H269 Unspecified cataract: Secondary | ICD-10-CM | POA: Diagnosis not present

## 2018-09-16 DIAGNOSIS — H269 Unspecified cataract: Secondary | ICD-10-CM | POA: Diagnosis not present

## 2018-09-17 DIAGNOSIS — H269 Unspecified cataract: Secondary | ICD-10-CM | POA: Diagnosis not present

## 2018-09-18 DIAGNOSIS — H269 Unspecified cataract: Secondary | ICD-10-CM | POA: Diagnosis not present

## 2018-09-19 DIAGNOSIS — H269 Unspecified cataract: Secondary | ICD-10-CM | POA: Diagnosis not present

## 2018-09-20 DIAGNOSIS — H269 Unspecified cataract: Secondary | ICD-10-CM | POA: Diagnosis not present

## 2018-09-21 DIAGNOSIS — H269 Unspecified cataract: Secondary | ICD-10-CM | POA: Diagnosis not present

## 2018-09-22 DIAGNOSIS — H269 Unspecified cataract: Secondary | ICD-10-CM | POA: Diagnosis not present

## 2018-09-23 DIAGNOSIS — H269 Unspecified cataract: Secondary | ICD-10-CM | POA: Diagnosis not present

## 2018-09-24 DIAGNOSIS — H269 Unspecified cataract: Secondary | ICD-10-CM | POA: Diagnosis not present

## 2018-09-25 DIAGNOSIS — H269 Unspecified cataract: Secondary | ICD-10-CM | POA: Diagnosis not present

## 2018-09-26 DIAGNOSIS — H269 Unspecified cataract: Secondary | ICD-10-CM | POA: Diagnosis not present

## 2018-09-27 DIAGNOSIS — H269 Unspecified cataract: Secondary | ICD-10-CM | POA: Diagnosis not present

## 2018-09-28 DIAGNOSIS — H269 Unspecified cataract: Secondary | ICD-10-CM | POA: Diagnosis not present

## 2018-09-29 DIAGNOSIS — H269 Unspecified cataract: Secondary | ICD-10-CM | POA: Diagnosis not present

## 2018-09-30 DIAGNOSIS — H269 Unspecified cataract: Secondary | ICD-10-CM | POA: Diagnosis not present

## 2018-10-01 DIAGNOSIS — H269 Unspecified cataract: Secondary | ICD-10-CM | POA: Diagnosis not present

## 2018-10-02 DIAGNOSIS — H269 Unspecified cataract: Secondary | ICD-10-CM | POA: Diagnosis not present

## 2018-10-03 DIAGNOSIS — H269 Unspecified cataract: Secondary | ICD-10-CM | POA: Diagnosis not present

## 2018-10-04 DIAGNOSIS — H269 Unspecified cataract: Secondary | ICD-10-CM | POA: Diagnosis not present

## 2018-10-05 DIAGNOSIS — H269 Unspecified cataract: Secondary | ICD-10-CM | POA: Diagnosis not present

## 2018-10-06 DIAGNOSIS — H269 Unspecified cataract: Secondary | ICD-10-CM | POA: Diagnosis not present

## 2018-10-07 DIAGNOSIS — H269 Unspecified cataract: Secondary | ICD-10-CM | POA: Diagnosis not present

## 2018-10-08 DIAGNOSIS — H269 Unspecified cataract: Secondary | ICD-10-CM | POA: Diagnosis not present

## 2018-10-09 DIAGNOSIS — H269 Unspecified cataract: Secondary | ICD-10-CM | POA: Diagnosis not present

## 2018-10-10 DIAGNOSIS — H269 Unspecified cataract: Secondary | ICD-10-CM | POA: Diagnosis not present

## 2018-10-11 DIAGNOSIS — H269 Unspecified cataract: Secondary | ICD-10-CM | POA: Diagnosis not present

## 2018-10-12 DIAGNOSIS — H269 Unspecified cataract: Secondary | ICD-10-CM | POA: Diagnosis not present

## 2018-10-13 DIAGNOSIS — H269 Unspecified cataract: Secondary | ICD-10-CM | POA: Diagnosis not present

## 2018-10-14 DIAGNOSIS — H269 Unspecified cataract: Secondary | ICD-10-CM | POA: Diagnosis not present

## 2018-10-15 DIAGNOSIS — H269 Unspecified cataract: Secondary | ICD-10-CM | POA: Diagnosis not present

## 2018-10-16 DIAGNOSIS — H269 Unspecified cataract: Secondary | ICD-10-CM | POA: Diagnosis not present

## 2018-10-17 DIAGNOSIS — H269 Unspecified cataract: Secondary | ICD-10-CM | POA: Diagnosis not present

## 2018-10-18 DIAGNOSIS — H269 Unspecified cataract: Secondary | ICD-10-CM | POA: Diagnosis not present

## 2018-10-19 DIAGNOSIS — H269 Unspecified cataract: Secondary | ICD-10-CM | POA: Diagnosis not present

## 2018-10-20 DIAGNOSIS — H269 Unspecified cataract: Secondary | ICD-10-CM | POA: Diagnosis not present

## 2018-10-21 DIAGNOSIS — H269 Unspecified cataract: Secondary | ICD-10-CM | POA: Diagnosis not present

## 2018-10-22 DIAGNOSIS — H269 Unspecified cataract: Secondary | ICD-10-CM | POA: Diagnosis not present

## 2018-10-23 DIAGNOSIS — H269 Unspecified cataract: Secondary | ICD-10-CM | POA: Diagnosis not present

## 2018-10-24 DIAGNOSIS — H269 Unspecified cataract: Secondary | ICD-10-CM | POA: Diagnosis not present

## 2018-10-25 DIAGNOSIS — H269 Unspecified cataract: Secondary | ICD-10-CM | POA: Diagnosis not present

## 2018-10-26 DIAGNOSIS — H269 Unspecified cataract: Secondary | ICD-10-CM | POA: Diagnosis not present

## 2018-10-27 DIAGNOSIS — H269 Unspecified cataract: Secondary | ICD-10-CM | POA: Diagnosis not present

## 2018-10-28 DIAGNOSIS — H269 Unspecified cataract: Secondary | ICD-10-CM | POA: Diagnosis not present

## 2018-10-29 DIAGNOSIS — H269 Unspecified cataract: Secondary | ICD-10-CM | POA: Diagnosis not present

## 2018-10-30 DIAGNOSIS — H269 Unspecified cataract: Secondary | ICD-10-CM | POA: Diagnosis not present

## 2018-10-31 DIAGNOSIS — H269 Unspecified cataract: Secondary | ICD-10-CM | POA: Diagnosis not present

## 2018-11-01 DIAGNOSIS — H269 Unspecified cataract: Secondary | ICD-10-CM | POA: Diagnosis not present

## 2018-11-02 DIAGNOSIS — H269 Unspecified cataract: Secondary | ICD-10-CM | POA: Diagnosis not present

## 2018-11-03 DIAGNOSIS — H269 Unspecified cataract: Secondary | ICD-10-CM | POA: Diagnosis not present

## 2018-11-04 DIAGNOSIS — H269 Unspecified cataract: Secondary | ICD-10-CM | POA: Diagnosis not present

## 2018-11-05 DIAGNOSIS — H269 Unspecified cataract: Secondary | ICD-10-CM | POA: Diagnosis not present

## 2018-11-06 DIAGNOSIS — H269 Unspecified cataract: Secondary | ICD-10-CM | POA: Diagnosis not present

## 2018-11-07 DIAGNOSIS — H269 Unspecified cataract: Secondary | ICD-10-CM | POA: Diagnosis not present

## 2018-11-08 DIAGNOSIS — H269 Unspecified cataract: Secondary | ICD-10-CM | POA: Diagnosis not present

## 2018-11-09 DIAGNOSIS — H269 Unspecified cataract: Secondary | ICD-10-CM | POA: Diagnosis not present

## 2018-11-10 DIAGNOSIS — H269 Unspecified cataract: Secondary | ICD-10-CM | POA: Diagnosis not present

## 2018-11-11 DIAGNOSIS — H269 Unspecified cataract: Secondary | ICD-10-CM | POA: Diagnosis not present

## 2018-11-12 DIAGNOSIS — H269 Unspecified cataract: Secondary | ICD-10-CM | POA: Diagnosis not present

## 2018-11-13 DIAGNOSIS — H269 Unspecified cataract: Secondary | ICD-10-CM | POA: Diagnosis not present

## 2018-11-14 DIAGNOSIS — H269 Unspecified cataract: Secondary | ICD-10-CM | POA: Diagnosis not present

## 2018-11-15 DIAGNOSIS — H269 Unspecified cataract: Secondary | ICD-10-CM | POA: Diagnosis not present

## 2018-11-16 DIAGNOSIS — H269 Unspecified cataract: Secondary | ICD-10-CM | POA: Diagnosis not present

## 2018-11-17 DIAGNOSIS — H269 Unspecified cataract: Secondary | ICD-10-CM | POA: Diagnosis not present

## 2018-11-18 DIAGNOSIS — H269 Unspecified cataract: Secondary | ICD-10-CM | POA: Diagnosis not present

## 2018-11-19 DIAGNOSIS — H269 Unspecified cataract: Secondary | ICD-10-CM | POA: Diagnosis not present

## 2018-11-20 DIAGNOSIS — H269 Unspecified cataract: Secondary | ICD-10-CM | POA: Diagnosis not present

## 2018-11-21 DIAGNOSIS — H269 Unspecified cataract: Secondary | ICD-10-CM | POA: Diagnosis not present

## 2018-11-22 DIAGNOSIS — H269 Unspecified cataract: Secondary | ICD-10-CM | POA: Diagnosis not present

## 2018-11-23 DIAGNOSIS — H269 Unspecified cataract: Secondary | ICD-10-CM | POA: Diagnosis not present

## 2018-11-24 DIAGNOSIS — H269 Unspecified cataract: Secondary | ICD-10-CM | POA: Diagnosis not present

## 2018-11-25 DIAGNOSIS — H269 Unspecified cataract: Secondary | ICD-10-CM | POA: Diagnosis not present

## 2018-11-26 DIAGNOSIS — H269 Unspecified cataract: Secondary | ICD-10-CM | POA: Diagnosis not present

## 2018-11-27 DIAGNOSIS — H269 Unspecified cataract: Secondary | ICD-10-CM | POA: Diagnosis not present

## 2018-11-28 DIAGNOSIS — H269 Unspecified cataract: Secondary | ICD-10-CM | POA: Diagnosis not present

## 2018-11-29 DIAGNOSIS — H269 Unspecified cataract: Secondary | ICD-10-CM | POA: Diagnosis not present

## 2018-11-29 DIAGNOSIS — I10 Essential (primary) hypertension: Secondary | ICD-10-CM | POA: Diagnosis not present

## 2018-11-29 DIAGNOSIS — E559 Vitamin D deficiency, unspecified: Secondary | ICD-10-CM | POA: Diagnosis not present

## 2018-11-29 DIAGNOSIS — E785 Hyperlipidemia, unspecified: Secondary | ICD-10-CM | POA: Diagnosis not present

## 2018-11-29 DIAGNOSIS — E663 Overweight: Secondary | ICD-10-CM | POA: Diagnosis not present

## 2018-11-30 DIAGNOSIS — H269 Unspecified cataract: Secondary | ICD-10-CM | POA: Diagnosis not present

## 2018-12-01 DIAGNOSIS — H269 Unspecified cataract: Secondary | ICD-10-CM | POA: Diagnosis not present

## 2018-12-02 DIAGNOSIS — H269 Unspecified cataract: Secondary | ICD-10-CM | POA: Diagnosis not present

## 2018-12-03 DIAGNOSIS — H269 Unspecified cataract: Secondary | ICD-10-CM | POA: Diagnosis not present

## 2018-12-04 DIAGNOSIS — H269 Unspecified cataract: Secondary | ICD-10-CM | POA: Diagnosis not present

## 2018-12-05 DIAGNOSIS — H269 Unspecified cataract: Secondary | ICD-10-CM | POA: Diagnosis not present

## 2018-12-06 DIAGNOSIS — H269 Unspecified cataract: Secondary | ICD-10-CM | POA: Diagnosis not present

## 2018-12-07 DIAGNOSIS — H269 Unspecified cataract: Secondary | ICD-10-CM | POA: Diagnosis not present

## 2018-12-08 DIAGNOSIS — H269 Unspecified cataract: Secondary | ICD-10-CM | POA: Diagnosis not present

## 2018-12-09 DIAGNOSIS — H269 Unspecified cataract: Secondary | ICD-10-CM | POA: Diagnosis not present

## 2018-12-10 DIAGNOSIS — H269 Unspecified cataract: Secondary | ICD-10-CM | POA: Diagnosis not present

## 2018-12-11 DIAGNOSIS — H269 Unspecified cataract: Secondary | ICD-10-CM | POA: Diagnosis not present

## 2018-12-12 DIAGNOSIS — H269 Unspecified cataract: Secondary | ICD-10-CM | POA: Diagnosis not present

## 2018-12-13 DIAGNOSIS — H269 Unspecified cataract: Secondary | ICD-10-CM | POA: Diagnosis not present

## 2018-12-14 DIAGNOSIS — H269 Unspecified cataract: Secondary | ICD-10-CM | POA: Diagnosis not present

## 2018-12-15 DIAGNOSIS — H269 Unspecified cataract: Secondary | ICD-10-CM | POA: Diagnosis not present

## 2018-12-16 DIAGNOSIS — H269 Unspecified cataract: Secondary | ICD-10-CM | POA: Diagnosis not present

## 2018-12-17 DIAGNOSIS — H269 Unspecified cataract: Secondary | ICD-10-CM | POA: Diagnosis not present

## 2018-12-18 DIAGNOSIS — H269 Unspecified cataract: Secondary | ICD-10-CM | POA: Diagnosis not present

## 2018-12-19 DIAGNOSIS — H269 Unspecified cataract: Secondary | ICD-10-CM | POA: Diagnosis not present

## 2018-12-20 DIAGNOSIS — H269 Unspecified cataract: Secondary | ICD-10-CM | POA: Diagnosis not present

## 2018-12-21 DIAGNOSIS — H269 Unspecified cataract: Secondary | ICD-10-CM | POA: Diagnosis not present

## 2018-12-22 DIAGNOSIS — H269 Unspecified cataract: Secondary | ICD-10-CM | POA: Diagnosis not present

## 2018-12-23 DIAGNOSIS — H269 Unspecified cataract: Secondary | ICD-10-CM | POA: Diagnosis not present

## 2018-12-24 DIAGNOSIS — H269 Unspecified cataract: Secondary | ICD-10-CM | POA: Diagnosis not present

## 2018-12-25 DIAGNOSIS — H269 Unspecified cataract: Secondary | ICD-10-CM | POA: Diagnosis not present

## 2018-12-26 DIAGNOSIS — H269 Unspecified cataract: Secondary | ICD-10-CM | POA: Diagnosis not present

## 2018-12-27 DIAGNOSIS — H269 Unspecified cataract: Secondary | ICD-10-CM | POA: Diagnosis not present

## 2018-12-28 DIAGNOSIS — H269 Unspecified cataract: Secondary | ICD-10-CM | POA: Diagnosis not present

## 2018-12-29 DIAGNOSIS — H269 Unspecified cataract: Secondary | ICD-10-CM | POA: Diagnosis not present

## 2018-12-30 DIAGNOSIS — H269 Unspecified cataract: Secondary | ICD-10-CM | POA: Diagnosis not present

## 2018-12-31 DIAGNOSIS — H269 Unspecified cataract: Secondary | ICD-10-CM | POA: Diagnosis not present

## 2019-01-01 DIAGNOSIS — H269 Unspecified cataract: Secondary | ICD-10-CM | POA: Diagnosis not present

## 2019-01-02 DIAGNOSIS — H269 Unspecified cataract: Secondary | ICD-10-CM | POA: Diagnosis not present

## 2019-01-03 DIAGNOSIS — H269 Unspecified cataract: Secondary | ICD-10-CM | POA: Diagnosis not present

## 2019-01-04 DIAGNOSIS — H269 Unspecified cataract: Secondary | ICD-10-CM | POA: Diagnosis not present

## 2019-01-05 DIAGNOSIS — H269 Unspecified cataract: Secondary | ICD-10-CM | POA: Diagnosis not present

## 2019-01-06 DIAGNOSIS — H269 Unspecified cataract: Secondary | ICD-10-CM | POA: Diagnosis not present

## 2019-01-07 DIAGNOSIS — H269 Unspecified cataract: Secondary | ICD-10-CM | POA: Diagnosis not present

## 2019-01-08 DIAGNOSIS — H269 Unspecified cataract: Secondary | ICD-10-CM | POA: Diagnosis not present

## 2019-01-09 DIAGNOSIS — H269 Unspecified cataract: Secondary | ICD-10-CM | POA: Diagnosis not present

## 2019-01-10 DIAGNOSIS — H269 Unspecified cataract: Secondary | ICD-10-CM | POA: Diagnosis not present

## 2019-01-11 DIAGNOSIS — H269 Unspecified cataract: Secondary | ICD-10-CM | POA: Diagnosis not present

## 2019-01-12 DIAGNOSIS — H269 Unspecified cataract: Secondary | ICD-10-CM | POA: Diagnosis not present

## 2019-01-13 DIAGNOSIS — H269 Unspecified cataract: Secondary | ICD-10-CM | POA: Diagnosis not present

## 2019-01-14 DIAGNOSIS — H269 Unspecified cataract: Secondary | ICD-10-CM | POA: Diagnosis not present

## 2019-01-15 DIAGNOSIS — H269 Unspecified cataract: Secondary | ICD-10-CM | POA: Diagnosis not present

## 2019-01-16 DIAGNOSIS — H269 Unspecified cataract: Secondary | ICD-10-CM | POA: Diagnosis not present

## 2019-01-17 DIAGNOSIS — H269 Unspecified cataract: Secondary | ICD-10-CM | POA: Diagnosis not present

## 2019-01-18 DIAGNOSIS — H269 Unspecified cataract: Secondary | ICD-10-CM | POA: Diagnosis not present

## 2019-01-19 DIAGNOSIS — H269 Unspecified cataract: Secondary | ICD-10-CM | POA: Diagnosis not present

## 2019-01-20 DIAGNOSIS — H269 Unspecified cataract: Secondary | ICD-10-CM | POA: Diagnosis not present

## 2019-01-21 DIAGNOSIS — H269 Unspecified cataract: Secondary | ICD-10-CM | POA: Diagnosis not present

## 2019-01-22 DIAGNOSIS — H269 Unspecified cataract: Secondary | ICD-10-CM | POA: Diagnosis not present

## 2019-01-23 DIAGNOSIS — H269 Unspecified cataract: Secondary | ICD-10-CM | POA: Diagnosis not present

## 2019-01-24 DIAGNOSIS — H269 Unspecified cataract: Secondary | ICD-10-CM | POA: Diagnosis not present

## 2019-01-25 DIAGNOSIS — H269 Unspecified cataract: Secondary | ICD-10-CM | POA: Diagnosis not present

## 2019-01-26 DIAGNOSIS — H269 Unspecified cataract: Secondary | ICD-10-CM | POA: Diagnosis not present

## 2019-01-27 DIAGNOSIS — H269 Unspecified cataract: Secondary | ICD-10-CM | POA: Diagnosis not present

## 2019-01-28 DIAGNOSIS — H269 Unspecified cataract: Secondary | ICD-10-CM | POA: Diagnosis not present

## 2019-01-29 DIAGNOSIS — H269 Unspecified cataract: Secondary | ICD-10-CM | POA: Diagnosis not present

## 2019-01-30 DIAGNOSIS — H269 Unspecified cataract: Secondary | ICD-10-CM | POA: Diagnosis not present

## 2019-01-31 DIAGNOSIS — H269 Unspecified cataract: Secondary | ICD-10-CM | POA: Diagnosis not present

## 2019-02-01 DIAGNOSIS — H269 Unspecified cataract: Secondary | ICD-10-CM | POA: Diagnosis not present

## 2019-02-02 DIAGNOSIS — H269 Unspecified cataract: Secondary | ICD-10-CM | POA: Diagnosis not present

## 2019-02-03 DIAGNOSIS — H269 Unspecified cataract: Secondary | ICD-10-CM | POA: Diagnosis not present

## 2019-02-04 DIAGNOSIS — H269 Unspecified cataract: Secondary | ICD-10-CM | POA: Diagnosis not present

## 2019-02-05 DIAGNOSIS — H269 Unspecified cataract: Secondary | ICD-10-CM | POA: Diagnosis not present

## 2019-02-06 DIAGNOSIS — H269 Unspecified cataract: Secondary | ICD-10-CM | POA: Diagnosis not present

## 2019-02-07 DIAGNOSIS — H269 Unspecified cataract: Secondary | ICD-10-CM | POA: Diagnosis not present

## 2019-02-08 DIAGNOSIS — H269 Unspecified cataract: Secondary | ICD-10-CM | POA: Diagnosis not present

## 2019-02-09 DIAGNOSIS — H269 Unspecified cataract: Secondary | ICD-10-CM | POA: Diagnosis not present

## 2019-02-10 DIAGNOSIS — H269 Unspecified cataract: Secondary | ICD-10-CM | POA: Diagnosis not present

## 2019-02-11 DIAGNOSIS — H269 Unspecified cataract: Secondary | ICD-10-CM | POA: Diagnosis not present

## 2019-02-12 DIAGNOSIS — H269 Unspecified cataract: Secondary | ICD-10-CM | POA: Diagnosis not present

## 2019-02-13 DIAGNOSIS — H269 Unspecified cataract: Secondary | ICD-10-CM | POA: Diagnosis not present

## 2019-02-14 DIAGNOSIS — H269 Unspecified cataract: Secondary | ICD-10-CM | POA: Diagnosis not present

## 2019-02-15 DIAGNOSIS — H269 Unspecified cataract: Secondary | ICD-10-CM | POA: Diagnosis not present

## 2019-02-16 DIAGNOSIS — H269 Unspecified cataract: Secondary | ICD-10-CM | POA: Diagnosis not present

## 2019-02-17 DIAGNOSIS — H269 Unspecified cataract: Secondary | ICD-10-CM | POA: Diagnosis not present

## 2019-02-18 DIAGNOSIS — H269 Unspecified cataract: Secondary | ICD-10-CM | POA: Diagnosis not present

## 2019-02-19 DIAGNOSIS — H269 Unspecified cataract: Secondary | ICD-10-CM | POA: Diagnosis not present

## 2019-02-20 DIAGNOSIS — H269 Unspecified cataract: Secondary | ICD-10-CM | POA: Diagnosis not present

## 2019-02-21 DIAGNOSIS — H269 Unspecified cataract: Secondary | ICD-10-CM | POA: Diagnosis not present

## 2019-02-22 DIAGNOSIS — H269 Unspecified cataract: Secondary | ICD-10-CM | POA: Diagnosis not present

## 2019-02-23 DIAGNOSIS — H269 Unspecified cataract: Secondary | ICD-10-CM | POA: Diagnosis not present

## 2019-02-24 DIAGNOSIS — H269 Unspecified cataract: Secondary | ICD-10-CM | POA: Diagnosis not present

## 2019-02-25 DIAGNOSIS — H269 Unspecified cataract: Secondary | ICD-10-CM | POA: Diagnosis not present

## 2019-02-26 DIAGNOSIS — H269 Unspecified cataract: Secondary | ICD-10-CM | POA: Diagnosis not present

## 2019-02-27 ENCOUNTER — Ambulatory Visit (INDEPENDENT_AMBULATORY_CARE_PROVIDER_SITE_OTHER): Payer: Medicaid Other | Admitting: Nurse Practitioner

## 2019-02-27 DIAGNOSIS — H269 Unspecified cataract: Secondary | ICD-10-CM | POA: Diagnosis not present

## 2019-02-28 ENCOUNTER — Other Ambulatory Visit (HOSPITAL_COMMUNITY): Payer: Self-pay | Admitting: Nurse Practitioner

## 2019-02-28 ENCOUNTER — Other Ambulatory Visit: Payer: Self-pay

## 2019-02-28 ENCOUNTER — Ambulatory Visit (HOSPITAL_COMMUNITY)
Admission: RE | Admit: 2019-02-28 | Discharge: 2019-02-28 | Disposition: A | Discharge status: Home / Self Care | Payer: Medicaid Other | Source: Ambulatory Visit | Attending: Nurse Practitioner | Admitting: Nurse Practitioner

## 2019-02-28 DIAGNOSIS — R059 Cough, unspecified: Secondary | ICD-10-CM

## 2019-02-28 DIAGNOSIS — R05 Cough: Secondary | ICD-10-CM

## 2019-02-28 DIAGNOSIS — R0602 Shortness of breath: Secondary | ICD-10-CM | POA: Diagnosis not present

## 2019-02-28 DIAGNOSIS — H269 Unspecified cataract: Secondary | ICD-10-CM | POA: Diagnosis not present

## 2019-03-01 DIAGNOSIS — H269 Unspecified cataract: Secondary | ICD-10-CM | POA: Diagnosis not present

## 2019-03-02 DIAGNOSIS — H269 Unspecified cataract: Secondary | ICD-10-CM | POA: Diagnosis not present

## 2019-03-03 DIAGNOSIS — H269 Unspecified cataract: Secondary | ICD-10-CM | POA: Diagnosis not present

## 2019-03-04 DIAGNOSIS — H269 Unspecified cataract: Secondary | ICD-10-CM | POA: Diagnosis not present

## 2019-03-05 DIAGNOSIS — H269 Unspecified cataract: Secondary | ICD-10-CM | POA: Diagnosis not present

## 2019-03-06 DIAGNOSIS — H269 Unspecified cataract: Secondary | ICD-10-CM | POA: Diagnosis not present

## 2019-03-07 DIAGNOSIS — H269 Unspecified cataract: Secondary | ICD-10-CM | POA: Diagnosis not present

## 2019-03-08 DIAGNOSIS — H269 Unspecified cataract: Secondary | ICD-10-CM | POA: Diagnosis not present

## 2019-03-09 DIAGNOSIS — H269 Unspecified cataract: Secondary | ICD-10-CM | POA: Diagnosis not present

## 2019-03-10 DIAGNOSIS — H269 Unspecified cataract: Secondary | ICD-10-CM | POA: Diagnosis not present

## 2019-03-11 DIAGNOSIS — H269 Unspecified cataract: Secondary | ICD-10-CM | POA: Diagnosis not present

## 2019-03-12 DIAGNOSIS — H269 Unspecified cataract: Secondary | ICD-10-CM | POA: Diagnosis not present

## 2019-03-13 DIAGNOSIS — H269 Unspecified cataract: Secondary | ICD-10-CM | POA: Diagnosis not present

## 2019-03-14 DIAGNOSIS — H269 Unspecified cataract: Secondary | ICD-10-CM | POA: Diagnosis not present

## 2019-03-15 DIAGNOSIS — H269 Unspecified cataract: Secondary | ICD-10-CM | POA: Diagnosis not present

## 2019-03-16 DIAGNOSIS — H269 Unspecified cataract: Secondary | ICD-10-CM | POA: Diagnosis not present

## 2019-03-17 DIAGNOSIS — H269 Unspecified cataract: Secondary | ICD-10-CM | POA: Diagnosis not present

## 2019-03-18 DIAGNOSIS — H269 Unspecified cataract: Secondary | ICD-10-CM | POA: Diagnosis not present

## 2019-03-19 DIAGNOSIS — H269 Unspecified cataract: Secondary | ICD-10-CM | POA: Diagnosis not present

## 2019-03-20 DIAGNOSIS — H269 Unspecified cataract: Secondary | ICD-10-CM | POA: Diagnosis not present

## 2019-03-21 DIAGNOSIS — H269 Unspecified cataract: Secondary | ICD-10-CM | POA: Diagnosis not present

## 2019-03-22 DIAGNOSIS — H269 Unspecified cataract: Secondary | ICD-10-CM | POA: Diagnosis not present

## 2019-03-23 DIAGNOSIS — H269 Unspecified cataract: Secondary | ICD-10-CM | POA: Diagnosis not present

## 2019-03-24 DIAGNOSIS — H269 Unspecified cataract: Secondary | ICD-10-CM | POA: Diagnosis not present

## 2019-03-25 DIAGNOSIS — H269 Unspecified cataract: Secondary | ICD-10-CM | POA: Diagnosis not present

## 2019-03-26 DIAGNOSIS — H269 Unspecified cataract: Secondary | ICD-10-CM | POA: Diagnosis not present

## 2019-03-27 DIAGNOSIS — H269 Unspecified cataract: Secondary | ICD-10-CM | POA: Diagnosis not present

## 2019-03-28 DIAGNOSIS — H269 Unspecified cataract: Secondary | ICD-10-CM | POA: Diagnosis not present

## 2019-03-29 DIAGNOSIS — H269 Unspecified cataract: Secondary | ICD-10-CM | POA: Diagnosis not present

## 2019-03-30 DIAGNOSIS — H269 Unspecified cataract: Secondary | ICD-10-CM | POA: Diagnosis not present

## 2019-03-31 DIAGNOSIS — H269 Unspecified cataract: Secondary | ICD-10-CM | POA: Diagnosis not present

## 2019-04-01 DIAGNOSIS — H269 Unspecified cataract: Secondary | ICD-10-CM | POA: Diagnosis not present

## 2019-04-02 DIAGNOSIS — H269 Unspecified cataract: Secondary | ICD-10-CM | POA: Diagnosis not present

## 2019-04-03 DIAGNOSIS — H269 Unspecified cataract: Secondary | ICD-10-CM | POA: Diagnosis not present

## 2019-04-04 DIAGNOSIS — H269 Unspecified cataract: Secondary | ICD-10-CM | POA: Diagnosis not present

## 2019-04-05 DIAGNOSIS — H269 Unspecified cataract: Secondary | ICD-10-CM | POA: Diagnosis not present

## 2019-04-06 DIAGNOSIS — H269 Unspecified cataract: Secondary | ICD-10-CM | POA: Diagnosis not present

## 2019-04-07 DIAGNOSIS — H269 Unspecified cataract: Secondary | ICD-10-CM | POA: Diagnosis not present

## 2019-04-08 DIAGNOSIS — H269 Unspecified cataract: Secondary | ICD-10-CM | POA: Diagnosis not present

## 2019-04-09 DIAGNOSIS — H269 Unspecified cataract: Secondary | ICD-10-CM | POA: Diagnosis not present

## 2019-04-10 DIAGNOSIS — H269 Unspecified cataract: Secondary | ICD-10-CM | POA: Diagnosis not present

## 2019-04-11 DIAGNOSIS — H269 Unspecified cataract: Secondary | ICD-10-CM | POA: Diagnosis not present

## 2019-04-12 DIAGNOSIS — H269 Unspecified cataract: Secondary | ICD-10-CM | POA: Diagnosis not present

## 2019-04-13 DIAGNOSIS — H269 Unspecified cataract: Secondary | ICD-10-CM | POA: Diagnosis not present

## 2019-04-14 DIAGNOSIS — H269 Unspecified cataract: Secondary | ICD-10-CM | POA: Diagnosis not present

## 2019-04-15 DIAGNOSIS — H269 Unspecified cataract: Secondary | ICD-10-CM | POA: Diagnosis not present

## 2019-04-16 DIAGNOSIS — H269 Unspecified cataract: Secondary | ICD-10-CM | POA: Diagnosis not present

## 2019-04-17 DIAGNOSIS — H269 Unspecified cataract: Secondary | ICD-10-CM | POA: Diagnosis not present

## 2019-04-18 DIAGNOSIS — H269 Unspecified cataract: Secondary | ICD-10-CM | POA: Diagnosis not present

## 2019-04-19 DIAGNOSIS — H269 Unspecified cataract: Secondary | ICD-10-CM | POA: Diagnosis not present

## 2019-04-20 DIAGNOSIS — H269 Unspecified cataract: Secondary | ICD-10-CM | POA: Diagnosis not present

## 2019-04-21 DIAGNOSIS — H269 Unspecified cataract: Secondary | ICD-10-CM | POA: Diagnosis not present

## 2019-04-22 DIAGNOSIS — H269 Unspecified cataract: Secondary | ICD-10-CM | POA: Diagnosis not present

## 2019-04-23 DIAGNOSIS — H269 Unspecified cataract: Secondary | ICD-10-CM | POA: Diagnosis not present

## 2019-04-24 DIAGNOSIS — H269 Unspecified cataract: Secondary | ICD-10-CM | POA: Diagnosis not present

## 2019-04-25 DIAGNOSIS — H269 Unspecified cataract: Secondary | ICD-10-CM | POA: Diagnosis not present

## 2019-04-26 DIAGNOSIS — H269 Unspecified cataract: Secondary | ICD-10-CM | POA: Diagnosis not present

## 2019-04-27 DIAGNOSIS — H269 Unspecified cataract: Secondary | ICD-10-CM | POA: Diagnosis not present

## 2019-04-28 DIAGNOSIS — H269 Unspecified cataract: Secondary | ICD-10-CM | POA: Diagnosis not present

## 2019-04-29 DIAGNOSIS — H269 Unspecified cataract: Secondary | ICD-10-CM | POA: Diagnosis not present

## 2019-04-30 DIAGNOSIS — H269 Unspecified cataract: Secondary | ICD-10-CM | POA: Diagnosis not present

## 2019-05-01 DIAGNOSIS — H269 Unspecified cataract: Secondary | ICD-10-CM | POA: Diagnosis not present

## 2019-05-02 DIAGNOSIS — H269 Unspecified cataract: Secondary | ICD-10-CM | POA: Diagnosis not present

## 2019-05-03 DIAGNOSIS — H269 Unspecified cataract: Secondary | ICD-10-CM | POA: Diagnosis not present

## 2019-05-04 DIAGNOSIS — H269 Unspecified cataract: Secondary | ICD-10-CM | POA: Diagnosis not present

## 2019-05-05 DIAGNOSIS — H269 Unspecified cataract: Secondary | ICD-10-CM | POA: Diagnosis not present

## 2019-05-06 DIAGNOSIS — H269 Unspecified cataract: Secondary | ICD-10-CM | POA: Diagnosis not present

## 2019-05-07 DIAGNOSIS — H269 Unspecified cataract: Secondary | ICD-10-CM | POA: Diagnosis not present

## 2019-05-08 DIAGNOSIS — H269 Unspecified cataract: Secondary | ICD-10-CM | POA: Diagnosis not present

## 2019-05-09 DIAGNOSIS — H269 Unspecified cataract: Secondary | ICD-10-CM | POA: Diagnosis not present

## 2019-05-10 DIAGNOSIS — H269 Unspecified cataract: Secondary | ICD-10-CM | POA: Diagnosis not present

## 2019-05-11 DIAGNOSIS — H269 Unspecified cataract: Secondary | ICD-10-CM | POA: Diagnosis not present

## 2019-05-12 DIAGNOSIS — H269 Unspecified cataract: Secondary | ICD-10-CM | POA: Diagnosis not present

## 2019-05-13 DIAGNOSIS — H269 Unspecified cataract: Secondary | ICD-10-CM | POA: Diagnosis not present

## 2019-05-14 DIAGNOSIS — H269 Unspecified cataract: Secondary | ICD-10-CM | POA: Diagnosis not present

## 2019-05-15 DIAGNOSIS — H269 Unspecified cataract: Secondary | ICD-10-CM | POA: Diagnosis not present

## 2019-05-16 DIAGNOSIS — H269 Unspecified cataract: Secondary | ICD-10-CM | POA: Diagnosis not present

## 2019-05-17 DIAGNOSIS — H269 Unspecified cataract: Secondary | ICD-10-CM | POA: Diagnosis not present

## 2019-05-18 DIAGNOSIS — H269 Unspecified cataract: Secondary | ICD-10-CM | POA: Diagnosis not present

## 2019-05-19 DIAGNOSIS — H269 Unspecified cataract: Secondary | ICD-10-CM | POA: Diagnosis not present

## 2019-05-20 DIAGNOSIS — H269 Unspecified cataract: Secondary | ICD-10-CM | POA: Diagnosis not present

## 2019-05-21 DIAGNOSIS — H269 Unspecified cataract: Secondary | ICD-10-CM | POA: Diagnosis not present

## 2019-05-22 DIAGNOSIS — H269 Unspecified cataract: Secondary | ICD-10-CM | POA: Diagnosis not present

## 2019-05-23 DIAGNOSIS — H269 Unspecified cataract: Secondary | ICD-10-CM | POA: Diagnosis not present

## 2019-05-24 DIAGNOSIS — H269 Unspecified cataract: Secondary | ICD-10-CM | POA: Diagnosis not present

## 2019-05-25 DIAGNOSIS — H269 Unspecified cataract: Secondary | ICD-10-CM | POA: Diagnosis not present

## 2019-05-26 DIAGNOSIS — H269 Unspecified cataract: Secondary | ICD-10-CM | POA: Diagnosis not present

## 2019-05-27 DIAGNOSIS — H269 Unspecified cataract: Secondary | ICD-10-CM | POA: Diagnosis not present

## 2019-05-28 DIAGNOSIS — H269 Unspecified cataract: Secondary | ICD-10-CM | POA: Diagnosis not present

## 2019-05-29 DIAGNOSIS — H269 Unspecified cataract: Secondary | ICD-10-CM | POA: Diagnosis not present

## 2019-05-30 DIAGNOSIS — H269 Unspecified cataract: Secondary | ICD-10-CM | POA: Diagnosis not present

## 2019-05-31 DIAGNOSIS — H269 Unspecified cataract: Secondary | ICD-10-CM | POA: Diagnosis not present

## 2019-06-01 DIAGNOSIS — H269 Unspecified cataract: Secondary | ICD-10-CM | POA: Diagnosis not present

## 2019-06-02 DIAGNOSIS — H269 Unspecified cataract: Secondary | ICD-10-CM | POA: Diagnosis not present

## 2019-06-03 DIAGNOSIS — E559 Vitamin D deficiency, unspecified: Secondary | ICD-10-CM | POA: Diagnosis not present

## 2019-06-03 DIAGNOSIS — I10 Essential (primary) hypertension: Secondary | ICD-10-CM | POA: Diagnosis not present

## 2019-06-03 DIAGNOSIS — E785 Hyperlipidemia, unspecified: Secondary | ICD-10-CM | POA: Diagnosis not present

## 2019-06-03 DIAGNOSIS — H269 Unspecified cataract: Secondary | ICD-10-CM | POA: Diagnosis not present

## 2019-06-04 DIAGNOSIS — H269 Unspecified cataract: Secondary | ICD-10-CM | POA: Diagnosis not present

## 2019-06-05 DIAGNOSIS — H269 Unspecified cataract: Secondary | ICD-10-CM | POA: Diagnosis not present

## 2019-06-06 DIAGNOSIS — H269 Unspecified cataract: Secondary | ICD-10-CM | POA: Diagnosis not present

## 2019-06-07 DIAGNOSIS — H269 Unspecified cataract: Secondary | ICD-10-CM | POA: Diagnosis not present

## 2019-06-08 DIAGNOSIS — H269 Unspecified cataract: Secondary | ICD-10-CM | POA: Diagnosis not present

## 2019-06-09 DIAGNOSIS — H269 Unspecified cataract: Secondary | ICD-10-CM | POA: Diagnosis not present

## 2019-06-10 DIAGNOSIS — H269 Unspecified cataract: Secondary | ICD-10-CM | POA: Diagnosis not present

## 2019-06-11 DIAGNOSIS — H269 Unspecified cataract: Secondary | ICD-10-CM | POA: Diagnosis not present

## 2019-06-12 DIAGNOSIS — H269 Unspecified cataract: Secondary | ICD-10-CM | POA: Diagnosis not present

## 2019-06-13 DIAGNOSIS — H269 Unspecified cataract: Secondary | ICD-10-CM | POA: Diagnosis not present

## 2019-06-14 DIAGNOSIS — H269 Unspecified cataract: Secondary | ICD-10-CM | POA: Diagnosis not present

## 2019-06-15 DIAGNOSIS — H269 Unspecified cataract: Secondary | ICD-10-CM | POA: Diagnosis not present

## 2019-06-16 DIAGNOSIS — H269 Unspecified cataract: Secondary | ICD-10-CM | POA: Diagnosis not present

## 2019-06-17 DIAGNOSIS — H269 Unspecified cataract: Secondary | ICD-10-CM | POA: Diagnosis not present

## 2019-06-18 DIAGNOSIS — H269 Unspecified cataract: Secondary | ICD-10-CM | POA: Diagnosis not present

## 2019-06-19 DIAGNOSIS — H269 Unspecified cataract: Secondary | ICD-10-CM | POA: Diagnosis not present

## 2019-06-20 DIAGNOSIS — H269 Unspecified cataract: Secondary | ICD-10-CM | POA: Diagnosis not present

## 2019-06-21 DIAGNOSIS — H269 Unspecified cataract: Secondary | ICD-10-CM | POA: Diagnosis not present

## 2019-06-22 DIAGNOSIS — H269 Unspecified cataract: Secondary | ICD-10-CM | POA: Diagnosis not present

## 2019-06-23 DIAGNOSIS — H269 Unspecified cataract: Secondary | ICD-10-CM | POA: Diagnosis not present

## 2019-06-24 DIAGNOSIS — H269 Unspecified cataract: Secondary | ICD-10-CM | POA: Diagnosis not present

## 2019-06-25 DIAGNOSIS — H269 Unspecified cataract: Secondary | ICD-10-CM | POA: Diagnosis not present

## 2019-06-26 DIAGNOSIS — H269 Unspecified cataract: Secondary | ICD-10-CM | POA: Diagnosis not present

## 2019-06-27 DIAGNOSIS — H269 Unspecified cataract: Secondary | ICD-10-CM | POA: Diagnosis not present

## 2019-06-28 ENCOUNTER — Other Ambulatory Visit (INDEPENDENT_AMBULATORY_CARE_PROVIDER_SITE_OTHER): Payer: Self-pay | Admitting: Nurse Practitioner

## 2019-06-28 DIAGNOSIS — H269 Unspecified cataract: Secondary | ICD-10-CM | POA: Diagnosis not present

## 2019-06-29 DIAGNOSIS — H269 Unspecified cataract: Secondary | ICD-10-CM | POA: Diagnosis not present

## 2019-06-30 DIAGNOSIS — H269 Unspecified cataract: Secondary | ICD-10-CM | POA: Diagnosis not present

## 2019-07-01 DIAGNOSIS — H269 Unspecified cataract: Secondary | ICD-10-CM | POA: Diagnosis not present

## 2019-07-02 DIAGNOSIS — H269 Unspecified cataract: Secondary | ICD-10-CM | POA: Diagnosis not present

## 2019-07-03 DIAGNOSIS — H269 Unspecified cataract: Secondary | ICD-10-CM | POA: Diagnosis not present

## 2019-07-04 DIAGNOSIS — H269 Unspecified cataract: Secondary | ICD-10-CM | POA: Diagnosis not present

## 2019-07-05 DIAGNOSIS — H269 Unspecified cataract: Secondary | ICD-10-CM | POA: Diagnosis not present

## 2019-07-06 DIAGNOSIS — H269 Unspecified cataract: Secondary | ICD-10-CM | POA: Diagnosis not present

## 2019-07-07 DIAGNOSIS — H269 Unspecified cataract: Secondary | ICD-10-CM | POA: Diagnosis not present

## 2019-07-08 DIAGNOSIS — H269 Unspecified cataract: Secondary | ICD-10-CM | POA: Diagnosis not present

## 2019-07-09 DIAGNOSIS — H269 Unspecified cataract: Secondary | ICD-10-CM | POA: Diagnosis not present

## 2019-07-10 DIAGNOSIS — H269 Unspecified cataract: Secondary | ICD-10-CM | POA: Diagnosis not present

## 2019-07-11 DIAGNOSIS — H269 Unspecified cataract: Secondary | ICD-10-CM | POA: Diagnosis not present

## 2019-07-12 DIAGNOSIS — H269 Unspecified cataract: Secondary | ICD-10-CM | POA: Diagnosis not present

## 2019-07-13 DIAGNOSIS — H269 Unspecified cataract: Secondary | ICD-10-CM | POA: Diagnosis not present

## 2019-07-14 DIAGNOSIS — H269 Unspecified cataract: Secondary | ICD-10-CM | POA: Diagnosis not present

## 2019-07-15 DIAGNOSIS — H269 Unspecified cataract: Secondary | ICD-10-CM | POA: Diagnosis not present

## 2019-07-16 DIAGNOSIS — H269 Unspecified cataract: Secondary | ICD-10-CM | POA: Diagnosis not present

## 2019-07-17 DIAGNOSIS — H269 Unspecified cataract: Secondary | ICD-10-CM | POA: Diagnosis not present

## 2019-07-18 DIAGNOSIS — H269 Unspecified cataract: Secondary | ICD-10-CM | POA: Diagnosis not present

## 2019-07-19 DIAGNOSIS — H269 Unspecified cataract: Secondary | ICD-10-CM | POA: Diagnosis not present

## 2019-07-20 DIAGNOSIS — H269 Unspecified cataract: Secondary | ICD-10-CM | POA: Diagnosis not present

## 2019-07-21 DIAGNOSIS — H269 Unspecified cataract: Secondary | ICD-10-CM | POA: Diagnosis not present

## 2019-07-22 DIAGNOSIS — H269 Unspecified cataract: Secondary | ICD-10-CM | POA: Diagnosis not present

## 2019-07-23 DIAGNOSIS — H269 Unspecified cataract: Secondary | ICD-10-CM | POA: Diagnosis not present

## 2019-07-24 DIAGNOSIS — H269 Unspecified cataract: Secondary | ICD-10-CM | POA: Diagnosis not present

## 2019-07-24 IMAGING — CT CT CHEST LUNG CANCER SCREENING LOW DOSE W/O CM
2 of 4 series · 15 of 40 positions shown, 18 images · non-contrast
Comparison: None.

CLINICAL DATA: 61-year-old male with 50 pack-year history of
smoking. Lung cancer screening.

EXAM:
CT CHEST WITHOUT CONTRAST LOW-DOSE FOR LUNG CANCER SCREENING
TECHNIQUE: Multidetector CT imaging of the chest was performed following the
standard protocol without IV contrast.

[Series 2: axial st · axial · 0.70mm/px · z∈[+1094,+1350]mm · 12 of 61 slices shown, 15 images]
[im 5/61  mediastinal]
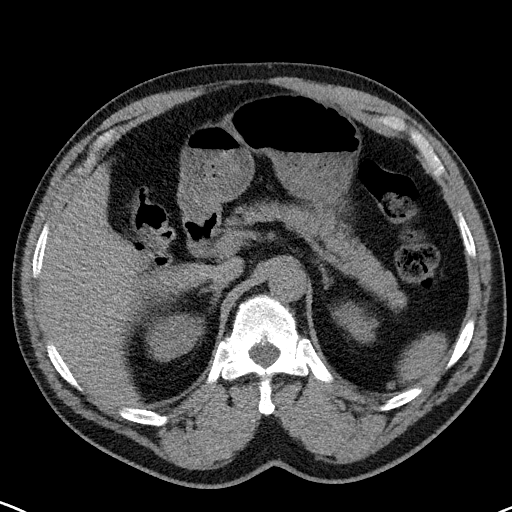
[im 5/61  lung]
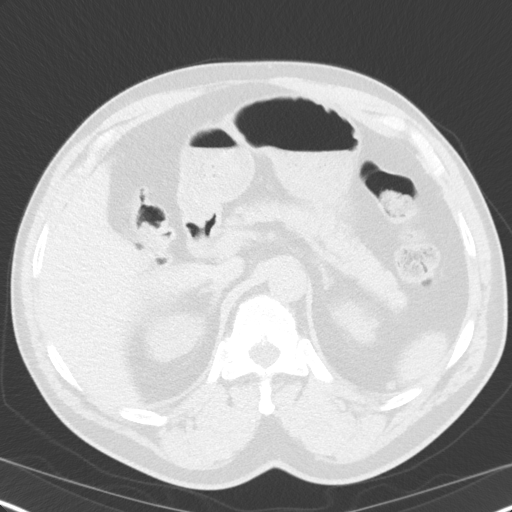
[im 10/61  lung]
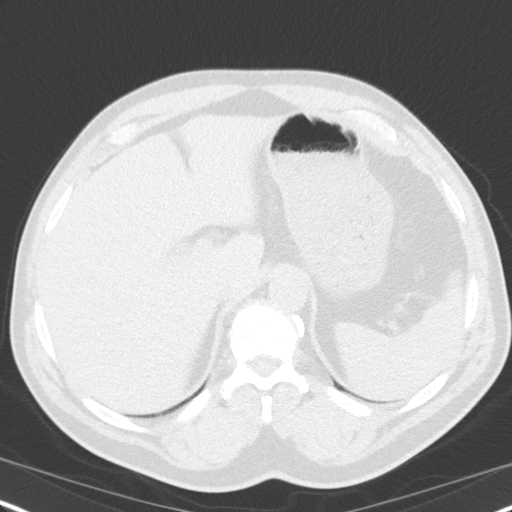
[im 14/61  lung]
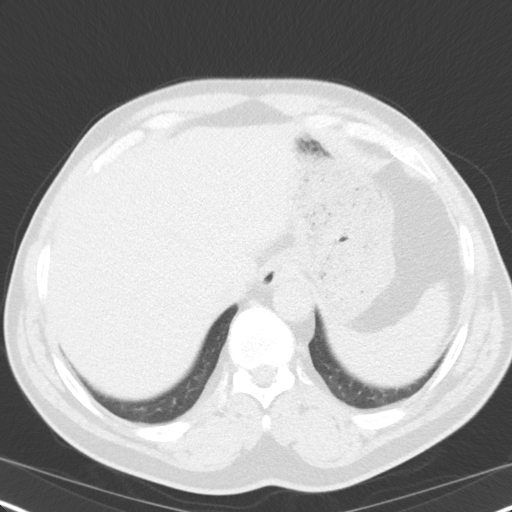
[im 19/61  lung]
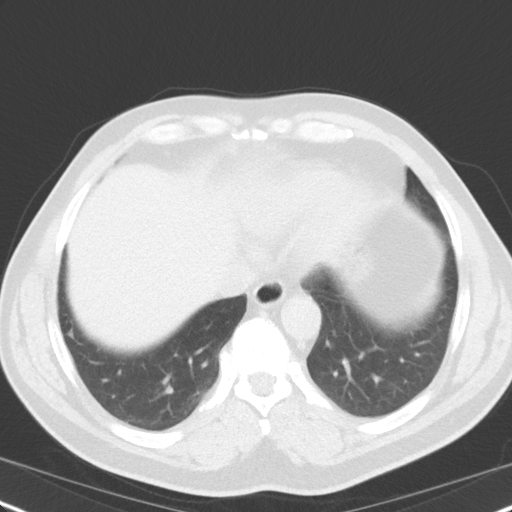
[im 24/61  mediastinal]
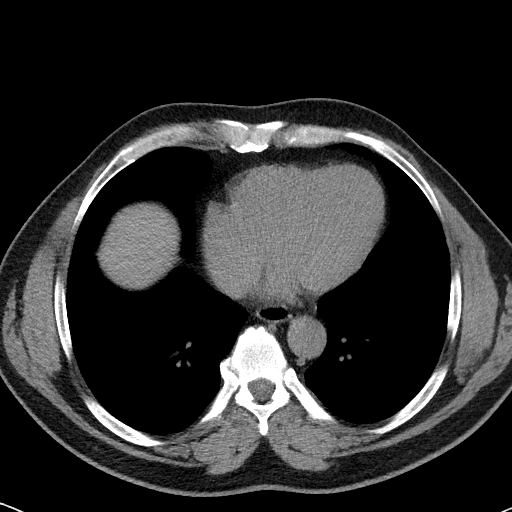
[im 24/61  lung]
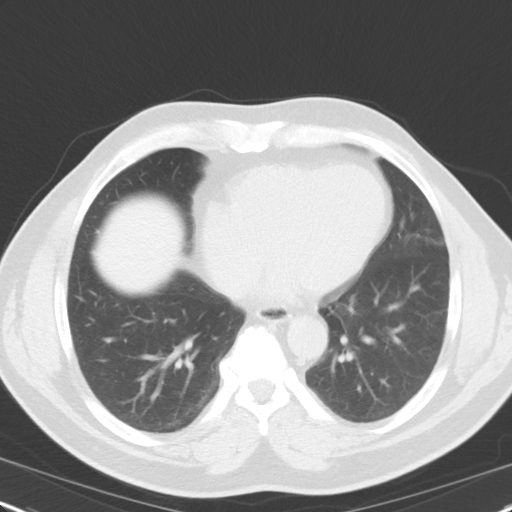
[im 28/61  lung]
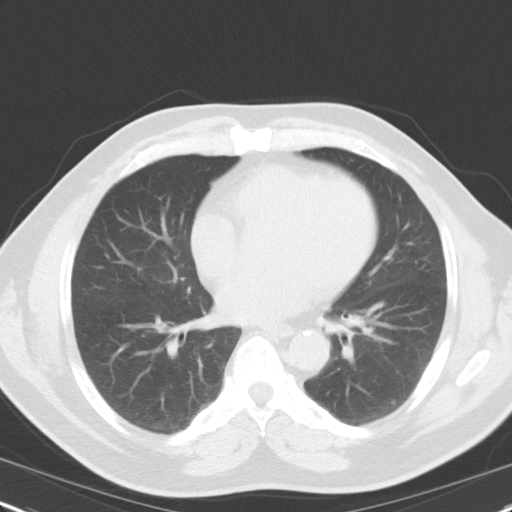
[im 33/61  lung]
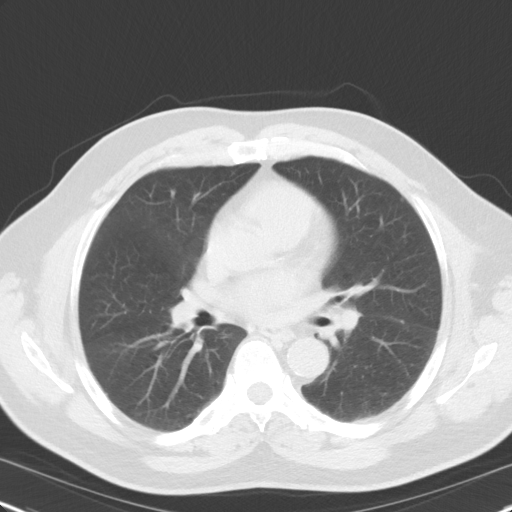
[im 37/61  lung]
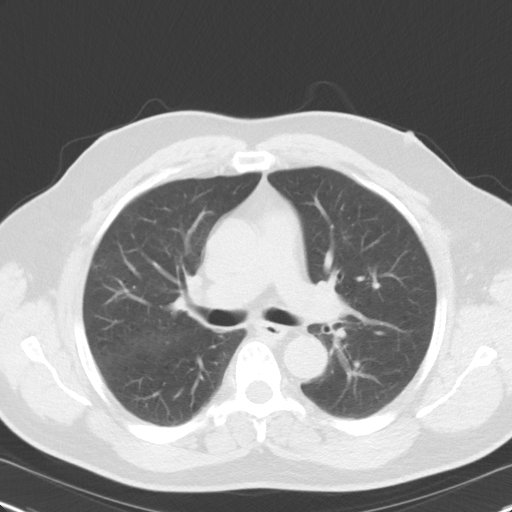
[im 42/61  mediastinal]
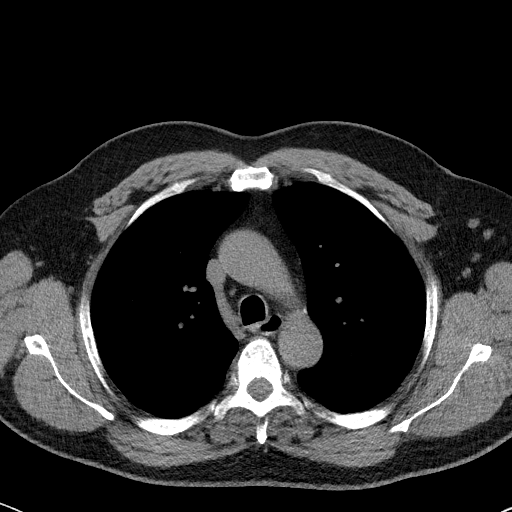
[im 42/61  lung]
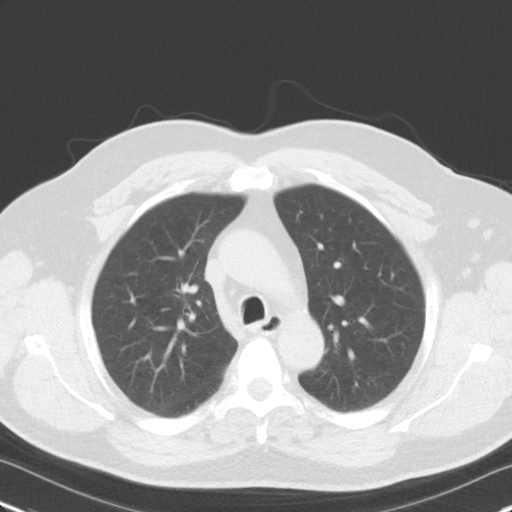
[im 47/61  lung]
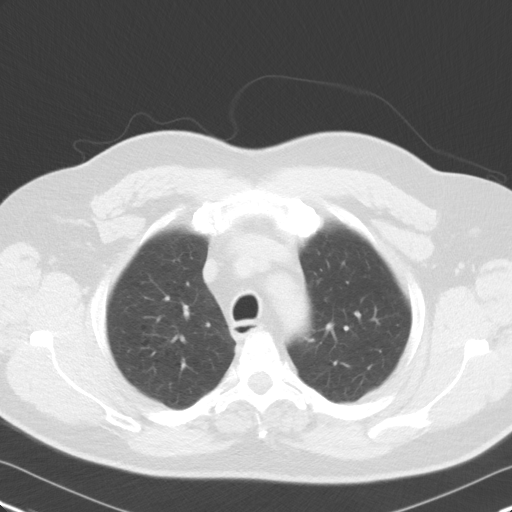
[im 51/61  lung]
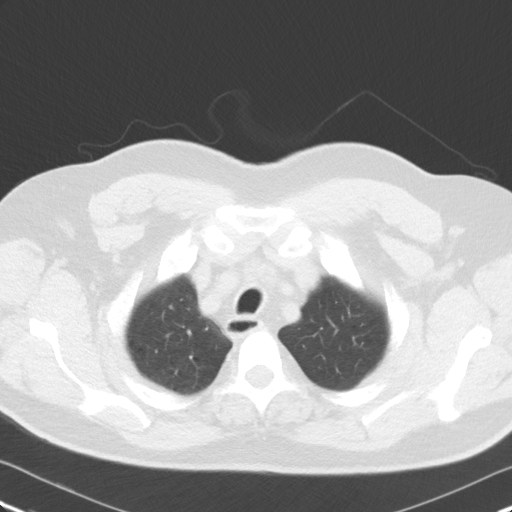
[im 56/61  lung]
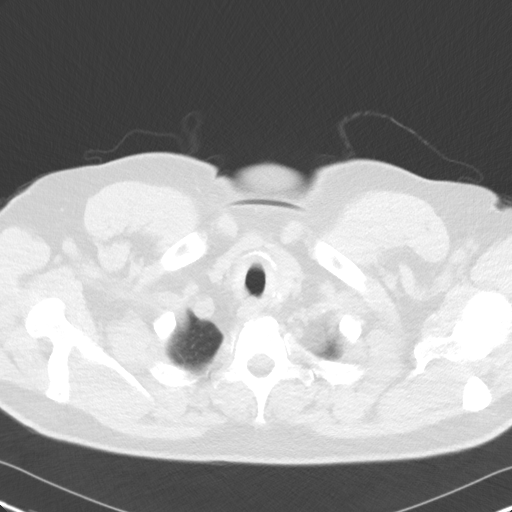

[Series 5: coronal · coronal · 0.62mm/px · 3 of 151 slices shown]
[im 31/151  lung]
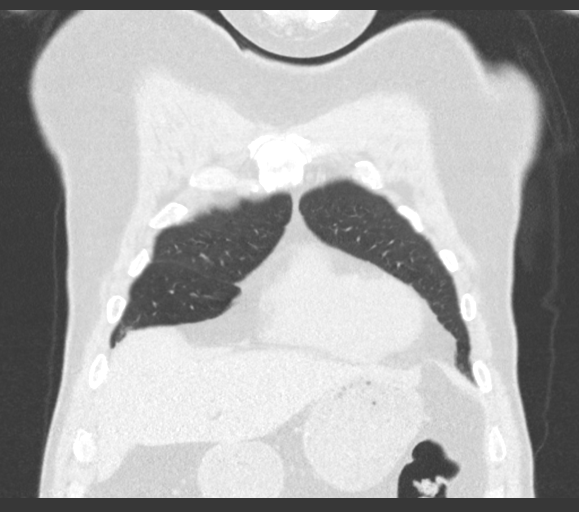
[im 61/151  lung]
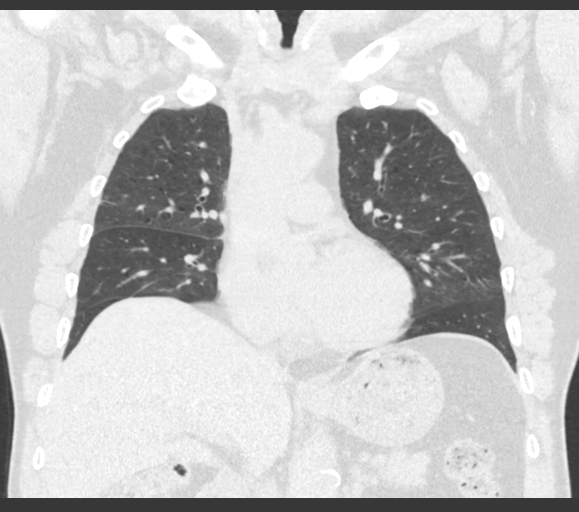
[im 91/151  lung]
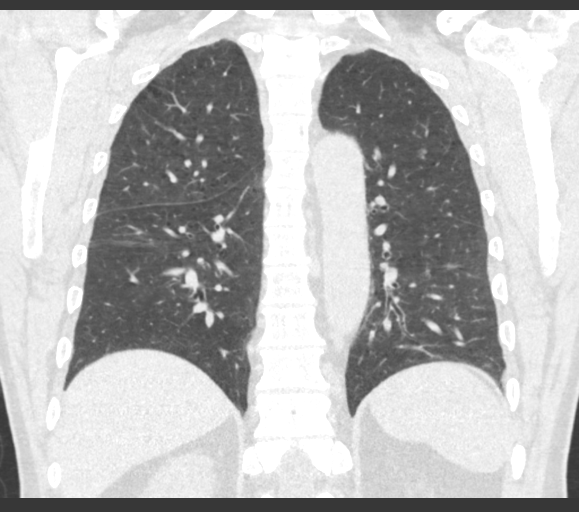

[15 of 40 positions shown; findings below may reference images not displayed]

FINDINGS: Cardiovascular: The heart size is normal. No pericardial effusion.
Atherosclerotic calcification is noted in the wall of the thoracic
aorta.

Mediastinum/Nodes: No mediastinal lymphadenopathy. No evidence for
gross hilar lymphadenopathy although assessment is limited by the
lack of intravenous contrast on today's study. The esophagus has
normal imaging features. There is no axillary lymphadenopathy.

Lungs/Pleura: Scattered tiny bilateral pulmonary nodules are
identified measuring up to a maximum volume derived equivalent
diameter 5.6 mm. No focal airspace consolidation. No pulmonary edema
or pleural effusion. Centrilobular emphysema is evident.

Upper Abdomen: Unremarkable.

Musculoskeletal: Bone windows reveal no worrisome lytic or sclerotic
osseous lesions.
IMPRESSION: 1. Lung-RADS 2, benign appearance or behavior. Continue annual
screening with low-dose chest CT without contrast in 12 months.
2.  Emphysema. (FSTZG-16T.0)
3.  Aortic Atherosclerois (FSTZG-170.0)

## 2019-07-25 DIAGNOSIS — H269 Unspecified cataract: Secondary | ICD-10-CM | POA: Diagnosis not present

## 2019-07-26 DIAGNOSIS — H269 Unspecified cataract: Secondary | ICD-10-CM | POA: Diagnosis not present

## 2019-07-27 DIAGNOSIS — H269 Unspecified cataract: Secondary | ICD-10-CM | POA: Diagnosis not present

## 2019-07-28 DIAGNOSIS — H269 Unspecified cataract: Secondary | ICD-10-CM | POA: Diagnosis not present

## 2019-07-29 DIAGNOSIS — H269 Unspecified cataract: Secondary | ICD-10-CM | POA: Diagnosis not present

## 2019-07-30 DIAGNOSIS — H269 Unspecified cataract: Secondary | ICD-10-CM | POA: Diagnosis not present

## 2019-07-31 DIAGNOSIS — H269 Unspecified cataract: Secondary | ICD-10-CM | POA: Diagnosis not present

## 2019-08-01 DIAGNOSIS — H269 Unspecified cataract: Secondary | ICD-10-CM | POA: Diagnosis not present

## 2019-08-02 DIAGNOSIS — H269 Unspecified cataract: Secondary | ICD-10-CM | POA: Diagnosis not present

## 2019-08-03 DIAGNOSIS — H269 Unspecified cataract: Secondary | ICD-10-CM | POA: Diagnosis not present

## 2019-08-04 DIAGNOSIS — H269 Unspecified cataract: Secondary | ICD-10-CM | POA: Diagnosis not present

## 2019-08-05 DIAGNOSIS — H269 Unspecified cataract: Secondary | ICD-10-CM | POA: Diagnosis not present

## 2019-08-06 DIAGNOSIS — H269 Unspecified cataract: Secondary | ICD-10-CM | POA: Diagnosis not present

## 2019-08-07 DIAGNOSIS — H269 Unspecified cataract: Secondary | ICD-10-CM | POA: Diagnosis not present

## 2019-08-08 DIAGNOSIS — H269 Unspecified cataract: Secondary | ICD-10-CM | POA: Diagnosis not present

## 2019-08-09 DIAGNOSIS — H269 Unspecified cataract: Secondary | ICD-10-CM | POA: Diagnosis not present

## 2019-08-10 DIAGNOSIS — H269 Unspecified cataract: Secondary | ICD-10-CM | POA: Diagnosis not present

## 2019-08-11 DIAGNOSIS — H269 Unspecified cataract: Secondary | ICD-10-CM | POA: Diagnosis not present

## 2019-08-12 DIAGNOSIS — H269 Unspecified cataract: Secondary | ICD-10-CM | POA: Diagnosis not present

## 2019-08-13 DIAGNOSIS — H269 Unspecified cataract: Secondary | ICD-10-CM | POA: Diagnosis not present

## 2019-08-14 DIAGNOSIS — H269 Unspecified cataract: Secondary | ICD-10-CM | POA: Diagnosis not present

## 2019-08-15 DIAGNOSIS — H269 Unspecified cataract: Secondary | ICD-10-CM | POA: Diagnosis not present

## 2019-08-16 DIAGNOSIS — H269 Unspecified cataract: Secondary | ICD-10-CM | POA: Diagnosis not present

## 2019-08-17 DIAGNOSIS — H269 Unspecified cataract: Secondary | ICD-10-CM | POA: Diagnosis not present

## 2019-08-18 DIAGNOSIS — H269 Unspecified cataract: Secondary | ICD-10-CM | POA: Diagnosis not present

## 2019-08-19 DIAGNOSIS — H269 Unspecified cataract: Secondary | ICD-10-CM | POA: Diagnosis not present

## 2019-08-20 DIAGNOSIS — H269 Unspecified cataract: Secondary | ICD-10-CM | POA: Diagnosis not present

## 2019-08-21 DIAGNOSIS — H269 Unspecified cataract: Secondary | ICD-10-CM | POA: Diagnosis not present

## 2019-08-22 DIAGNOSIS — H269 Unspecified cataract: Secondary | ICD-10-CM | POA: Diagnosis not present

## 2019-08-23 DIAGNOSIS — H269 Unspecified cataract: Secondary | ICD-10-CM | POA: Diagnosis not present

## 2019-08-24 DIAGNOSIS — H269 Unspecified cataract: Secondary | ICD-10-CM | POA: Diagnosis not present

## 2019-08-25 DIAGNOSIS — H269 Unspecified cataract: Secondary | ICD-10-CM | POA: Diagnosis not present

## 2019-08-26 DIAGNOSIS — H269 Unspecified cataract: Secondary | ICD-10-CM | POA: Diagnosis not present

## 2019-08-27 DIAGNOSIS — H269 Unspecified cataract: Secondary | ICD-10-CM | POA: Diagnosis not present

## 2019-08-28 DIAGNOSIS — H269 Unspecified cataract: Secondary | ICD-10-CM | POA: Diagnosis not present

## 2019-08-29 DIAGNOSIS — H269 Unspecified cataract: Secondary | ICD-10-CM | POA: Diagnosis not present

## 2019-08-30 DIAGNOSIS — H269 Unspecified cataract: Secondary | ICD-10-CM | POA: Diagnosis not present

## 2019-08-31 DIAGNOSIS — H269 Unspecified cataract: Secondary | ICD-10-CM | POA: Diagnosis not present

## 2019-09-01 DIAGNOSIS — H269 Unspecified cataract: Secondary | ICD-10-CM | POA: Diagnosis not present

## 2019-09-02 DIAGNOSIS — H269 Unspecified cataract: Secondary | ICD-10-CM | POA: Diagnosis not present

## 2019-09-03 DIAGNOSIS — H269 Unspecified cataract: Secondary | ICD-10-CM | POA: Diagnosis not present

## 2019-09-04 DIAGNOSIS — H269 Unspecified cataract: Secondary | ICD-10-CM | POA: Diagnosis not present

## 2019-09-05 DIAGNOSIS — H269 Unspecified cataract: Secondary | ICD-10-CM | POA: Diagnosis not present

## 2019-09-06 DIAGNOSIS — H269 Unspecified cataract: Secondary | ICD-10-CM | POA: Diagnosis not present

## 2019-09-07 DIAGNOSIS — H269 Unspecified cataract: Secondary | ICD-10-CM | POA: Diagnosis not present

## 2019-09-08 DIAGNOSIS — H269 Unspecified cataract: Secondary | ICD-10-CM | POA: Diagnosis not present

## 2019-09-09 ENCOUNTER — Encounter (INDEPENDENT_AMBULATORY_CARE_PROVIDER_SITE_OTHER): Payer: Self-pay | Admitting: Nurse Practitioner

## 2019-09-09 ENCOUNTER — Telehealth (INDEPENDENT_AMBULATORY_CARE_PROVIDER_SITE_OTHER): Payer: Medicaid Other | Admitting: Nurse Practitioner

## 2019-09-09 VITALS — Ht 71.0 in | Wt 180.0 lb

## 2019-09-09 DIAGNOSIS — Z720 Tobacco use: Secondary | ICD-10-CM

## 2019-09-09 DIAGNOSIS — H269 Unspecified cataract: Secondary | ICD-10-CM | POA: Diagnosis not present

## 2019-09-09 DIAGNOSIS — E559 Vitamin D deficiency, unspecified: Secondary | ICD-10-CM

## 2019-09-09 DIAGNOSIS — E785 Hyperlipidemia, unspecified: Secondary | ICD-10-CM | POA: Diagnosis not present

## 2019-09-09 DIAGNOSIS — I10 Essential (primary) hypertension: Secondary | ICD-10-CM

## 2019-09-09 MED ORDER — BLOOD PRESSURE MONITOR AUTOMAT DEVI: 0 refills | Status: DC

## 2019-09-09 NOTE — Progress Notes (Signed)
Subjective:  Patient ID: Drew Cruz, male    DOB: [birth year]  Age: 63 y.o. MRN: [id number]   Due to national recommendations of social distancing related to the [place] pandemic, an audio/visual tele-health visit was felt to be the most appropriate encounter type for this patient today. I connected with  Barnett Applebaum on 09/09/19 utilizing audio-only technology and verified that I am speaking with the correct person using two identifiers. The patient was located at their home, and I was located at the office of [facility] during the encounter.  Patient did not have access to visual technology to complete visit using video, thus audio only visit was conducted.  I discussed the limitations of evaluation and management by telemedicine. The patient expressed understanding and agreed to proceed.    CC:  Chief Complaint  Patient presents with  . Follow-up      HPI  This visit is to follow-up with chronic conditions.  Vitamin D deficiency: He is taking 1000 international units of vitamin D3 daily.  Last blood work complected in August 2020 showed a vitamin D level of 31.  Hypertension: He is currently on lisinopril 20 mg daily.  Last metabolic panel shows normal electrolytes and renal functioning.  Unfortunately he tells me he does not have a blood pressure cuff that he knows how to use at home to check his blood pressure.  Historically looking at his vital signs in this office visit his blood pressure has been well controlled on current regimen.  Hyperlipidemia: He has had a history of CVA as well.  He is on atorvastatin 80 mg and full dose aspirin daily.  Last lipid panel collected in September of this year shows LDL is at goal on current regimen.  Tobacco use: Patient is a current cigarette smoker.  He tells me he continues to smoke about 1 pack/day.  He is interested in quitting smoking.  Past Medical History:  Diagnosis Date  . Cataracts, bilateral   . Hypercholesteremia   .  Hypertension   . Pulmonary emphysema determined by X-ray ([place]) 07/04/2017  . Stroke ([place])    04/2015. left sided facial weakness.  . Substance abuse ([place])       Family History  Problem Relation Age of Onset  . Cancer Mother   . Early death Father        murder  . Other Sister 2       meningitis with resultant brain damage  . Colon cancer Neg Hx     Social History   Social History Narrative   Has an aid - makes sure takes meds, cooks   Disabled - uncertain why   "cant take care of myself"   Lives with mother and disabled sister   Leisure- watch TV, goes to friends house   Social History   Tobacco Use  . Smoking status: Current Every Day Smoker    Packs/day: 1.00    Years: 53.00    Pack years: 53.00    Types: Cigarettes    Start date: 10/03/1965  . Smokeless tobacco: Never Used  . Tobacco comment: Has been unsuccessful wih quitting attempts previously  Substance Use Topics  . Alcohol use: Yes    Alcohol/week: 3.0 standard drinks    Types: 3 Cans of beer per week    Comment: sometimes drinks 40 oz or 2-3 bottles/cans of beers a day since age 35 or 74.     Current Meds  Medication Sig  . aspirin  325 MG tablet Take 1 tablet (325 mg total) by mouth daily.  Marland Kitchen atorvastatin (LIPITOR) 80 MG tablet Take 1 tablet (80 mg total) by mouth daily.  . Cholecalciferol 25 MCG (1000 UT) CHEW Chew 1 tablet by mouth.  Marland Kitchen lisinopril (ZESTRIL) 20 MG tablet TAKE 1 TABLET BY MOUTH EVERY DAY    ROS:  Review of Systems  Constitutional: Negative for chills and fever.  Eyes: Negative for blurred vision and double vision.  Respiratory: Negative for cough, shortness of breath and wheezing.   Cardiovascular: Negative for chest pain and palpitations.  Neurological: Negative for dizziness and headaches.     Objective:   Today's Vitals: Ht 5\' 11"  (1.803 m)   Wt 180 lb (81.6 kg)   BMI 25.10 kg/m  Vitals with BMI 09/09/2019 08/11/2017 06/09/2017  Height 5\' 11"  5\' 10"  5\' 10"   Weight 180 lbs  221 lbs 216 lbs 1 oz  BMI [id number]  Systolic (No Data) [id number]  Diastolic (No Data) 80 82  Pulse - 84 70     Physical Exam Comprehensive physical exam not completed today as office visit was conducted remotely.  Patient did sound well over the phone.  He answered questions appropriately and was alert and oriented.   Assessment   1. Essential hypertension   2. Vitamin D deficiency   3. Hyperlipidemia, unspecified hyperlipidemia type   4. Tobacco abuse       Tests ordered No orders of the defined types were placed in this encounter.    Plan: 1.  He will continue on his current medication regimen.  I offered to send prescription for blood pressure cuff to his pharmacy, but tells me his mother has a blood pressure cuff that he can have his home health aide used to monitor his blood pressure.  I did tell him that we would like to see his blood pressure less than 140 over less than 90, and if he notes that his blood pressure is greater than or equal to 150/100 he should check his blood pressure again the following day and if it is still elevated he needs to call this office.  Otherwise, I told him he can check his blood pressure once a week to monitor it.  He tells me he understands.  2.  I encouraged him to increase his vitamin D3 intake from 1000 international units daily to 2000 international units daily may need to consider collecting serum level at next office visit if he increases his intake.  3.  Continue on current regimen.  We will continue to monitor blood work in subsequent visits.  4.  I again encouraged him to quit smoking cigarettes.  He tells me he will try to do this.  We discussed reducing intake slowly over time with goal of stopping smoking altogether.  I encouraged him to reduce his daily cigarette intake by 1 cigarette a day every week.  He tells me he will try this.   Meds ordered this encounter  Medications  . Blood Pressure Monitoring (BLOOD PRESSURE  MONITOR AUTOMAT) DEVI    Sig: Check blood pressure once daily    Dispense:  1 each    Refill:  0    Order Specific Question:   Supervising Provider    Answer:   Doree Albee [id number]   Today's telephone visit lasted for 17 minutes.  Patient to follow-up in 3 months.  He is encouraged to call the office with any questions or concerns prior to  his next upcoming appointment.  Ailene Ards, NP

## 2019-09-10 DIAGNOSIS — H269 Unspecified cataract: Secondary | ICD-10-CM | POA: Diagnosis not present

## 2019-09-11 DIAGNOSIS — H269 Unspecified cataract: Secondary | ICD-10-CM | POA: Diagnosis not present

## 2019-09-12 DIAGNOSIS — H269 Unspecified cataract: Secondary | ICD-10-CM | POA: Diagnosis not present

## 2019-09-13 DIAGNOSIS — H269 Unspecified cataract: Secondary | ICD-10-CM | POA: Diagnosis not present

## 2019-09-14 DIAGNOSIS — H269 Unspecified cataract: Secondary | ICD-10-CM | POA: Diagnosis not present

## 2019-09-15 DIAGNOSIS — H269 Unspecified cataract: Secondary | ICD-10-CM | POA: Diagnosis not present

## 2019-09-16 DIAGNOSIS — H269 Unspecified cataract: Secondary | ICD-10-CM | POA: Diagnosis not present

## 2019-09-17 DIAGNOSIS — H269 Unspecified cataract: Secondary | ICD-10-CM | POA: Diagnosis not present

## 2019-09-18 DIAGNOSIS — H269 Unspecified cataract: Secondary | ICD-10-CM | POA: Diagnosis not present

## 2019-09-19 DIAGNOSIS — H269 Unspecified cataract: Secondary | ICD-10-CM | POA: Diagnosis not present

## 2019-09-20 DIAGNOSIS — H269 Unspecified cataract: Secondary | ICD-10-CM | POA: Diagnosis not present

## 2019-09-21 DIAGNOSIS — H269 Unspecified cataract: Secondary | ICD-10-CM | POA: Diagnosis not present

## 2019-09-22 DIAGNOSIS — H269 Unspecified cataract: Secondary | ICD-10-CM | POA: Diagnosis not present

## 2019-09-23 DIAGNOSIS — H269 Unspecified cataract: Secondary | ICD-10-CM | POA: Diagnosis not present

## 2019-09-24 DIAGNOSIS — H269 Unspecified cataract: Secondary | ICD-10-CM | POA: Diagnosis not present

## 2019-09-25 DIAGNOSIS — H269 Unspecified cataract: Secondary | ICD-10-CM | POA: Diagnosis not present

## 2019-09-26 DIAGNOSIS — H269 Unspecified cataract: Secondary | ICD-10-CM | POA: Diagnosis not present

## 2019-09-27 DIAGNOSIS — H269 Unspecified cataract: Secondary | ICD-10-CM | POA: Diagnosis not present

## 2019-09-28 DIAGNOSIS — H269 Unspecified cataract: Secondary | ICD-10-CM | POA: Diagnosis not present

## 2019-09-29 DIAGNOSIS — H269 Unspecified cataract: Secondary | ICD-10-CM | POA: Diagnosis not present

## 2019-09-30 DIAGNOSIS — H269 Unspecified cataract: Secondary | ICD-10-CM | POA: Diagnosis not present

## 2019-10-01 DIAGNOSIS — H269 Unspecified cataract: Secondary | ICD-10-CM | POA: Diagnosis not present

## 2019-10-02 DIAGNOSIS — H269 Unspecified cataract: Secondary | ICD-10-CM | POA: Diagnosis not present

## 2019-10-03 DIAGNOSIS — H269 Unspecified cataract: Secondary | ICD-10-CM | POA: Diagnosis not present

## 2019-10-04 DIAGNOSIS — H269 Unspecified cataract: Secondary | ICD-10-CM | POA: Diagnosis not present

## 2019-10-05 DIAGNOSIS — H269 Unspecified cataract: Secondary | ICD-10-CM | POA: Diagnosis not present

## 2019-10-06 DIAGNOSIS — H269 Unspecified cataract: Secondary | ICD-10-CM | POA: Diagnosis not present

## 2019-10-07 ENCOUNTER — Other Ambulatory Visit (INDEPENDENT_AMBULATORY_CARE_PROVIDER_SITE_OTHER): Payer: Self-pay | Admitting: Internal Medicine

## 2019-10-07 DIAGNOSIS — H269 Unspecified cataract: Secondary | ICD-10-CM | POA: Diagnosis not present

## 2019-10-08 DIAGNOSIS — H269 Unspecified cataract: Secondary | ICD-10-CM | POA: Diagnosis not present

## 2019-10-09 ENCOUNTER — Other Ambulatory Visit (INDEPENDENT_AMBULATORY_CARE_PROVIDER_SITE_OTHER): Payer: Self-pay | Admitting: Internal Medicine

## 2019-10-09 DIAGNOSIS — H269 Unspecified cataract: Secondary | ICD-10-CM | POA: Diagnosis not present

## 2019-10-10 DIAGNOSIS — H269 Unspecified cataract: Secondary | ICD-10-CM | POA: Diagnosis not present

## 2019-10-11 DIAGNOSIS — H269 Unspecified cataract: Secondary | ICD-10-CM | POA: Diagnosis not present

## 2019-10-12 DIAGNOSIS — H269 Unspecified cataract: Secondary | ICD-10-CM | POA: Diagnosis not present

## 2019-10-13 DIAGNOSIS — H269 Unspecified cataract: Secondary | ICD-10-CM | POA: Diagnosis not present

## 2019-10-14 DIAGNOSIS — H269 Unspecified cataract: Secondary | ICD-10-CM | POA: Diagnosis not present

## 2019-10-15 DIAGNOSIS — H269 Unspecified cataract: Secondary | ICD-10-CM | POA: Diagnosis not present

## 2019-10-16 DIAGNOSIS — H269 Unspecified cataract: Secondary | ICD-10-CM | POA: Diagnosis not present

## 2019-10-17 DIAGNOSIS — H269 Unspecified cataract: Secondary | ICD-10-CM | POA: Diagnosis not present

## 2019-10-18 DIAGNOSIS — H269 Unspecified cataract: Secondary | ICD-10-CM | POA: Diagnosis not present

## 2019-10-19 DIAGNOSIS — H269 Unspecified cataract: Secondary | ICD-10-CM | POA: Diagnosis not present

## 2019-10-20 DIAGNOSIS — H269 Unspecified cataract: Secondary | ICD-10-CM | POA: Diagnosis not present

## 2019-10-21 DIAGNOSIS — H269 Unspecified cataract: Secondary | ICD-10-CM | POA: Diagnosis not present

## 2019-10-22 DIAGNOSIS — H269 Unspecified cataract: Secondary | ICD-10-CM | POA: Diagnosis not present

## 2019-10-23 DIAGNOSIS — H269 Unspecified cataract: Secondary | ICD-10-CM | POA: Diagnosis not present

## 2019-10-24 DIAGNOSIS — H269 Unspecified cataract: Secondary | ICD-10-CM | POA: Diagnosis not present

## 2019-10-25 DIAGNOSIS — H269 Unspecified cataract: Secondary | ICD-10-CM | POA: Diagnosis not present

## 2019-10-26 DIAGNOSIS — H269 Unspecified cataract: Secondary | ICD-10-CM | POA: Diagnosis not present

## 2019-10-27 DIAGNOSIS — H269 Unspecified cataract: Secondary | ICD-10-CM | POA: Diagnosis not present

## 2019-10-28 DIAGNOSIS — H269 Unspecified cataract: Secondary | ICD-10-CM | POA: Diagnosis not present

## 2019-10-29 DIAGNOSIS — H269 Unspecified cataract: Secondary | ICD-10-CM | POA: Diagnosis not present

## 2019-10-30 DIAGNOSIS — H269 Unspecified cataract: Secondary | ICD-10-CM | POA: Diagnosis not present

## 2019-10-31 DIAGNOSIS — H269 Unspecified cataract: Secondary | ICD-10-CM | POA: Diagnosis not present

## 2019-11-01 DIAGNOSIS — H269 Unspecified cataract: Secondary | ICD-10-CM | POA: Diagnosis not present

## 2019-11-02 DIAGNOSIS — H269 Unspecified cataract: Secondary | ICD-10-CM | POA: Diagnosis not present

## 2019-11-03 DIAGNOSIS — H269 Unspecified cataract: Secondary | ICD-10-CM | POA: Diagnosis not present

## 2019-11-04 DIAGNOSIS — H269 Unspecified cataract: Secondary | ICD-10-CM | POA: Diagnosis not present

## 2019-11-05 DIAGNOSIS — H269 Unspecified cataract: Secondary | ICD-10-CM | POA: Diagnosis not present

## 2019-11-06 DIAGNOSIS — H269 Unspecified cataract: Secondary | ICD-10-CM | POA: Diagnosis not present

## 2019-11-07 DIAGNOSIS — H269 Unspecified cataract: Secondary | ICD-10-CM | POA: Diagnosis not present

## 2019-11-08 DIAGNOSIS — H269 Unspecified cataract: Secondary | ICD-10-CM | POA: Diagnosis not present

## 2019-11-09 DIAGNOSIS — H269 Unspecified cataract: Secondary | ICD-10-CM | POA: Diagnosis not present

## 2019-11-10 DIAGNOSIS — H269 Unspecified cataract: Secondary | ICD-10-CM | POA: Diagnosis not present

## 2019-11-11 DIAGNOSIS — H269 Unspecified cataract: Secondary | ICD-10-CM | POA: Diagnosis not present

## 2019-11-12 DIAGNOSIS — H269 Unspecified cataract: Secondary | ICD-10-CM | POA: Diagnosis not present

## 2019-11-13 DIAGNOSIS — H269 Unspecified cataract: Secondary | ICD-10-CM | POA: Diagnosis not present

## 2019-11-14 DIAGNOSIS — H269 Unspecified cataract: Secondary | ICD-10-CM | POA: Diagnosis not present

## 2019-11-15 DIAGNOSIS — H269 Unspecified cataract: Secondary | ICD-10-CM | POA: Diagnosis not present

## 2019-11-16 DIAGNOSIS — H269 Unspecified cataract: Secondary | ICD-10-CM | POA: Diagnosis not present

## 2019-11-17 DIAGNOSIS — H269 Unspecified cataract: Secondary | ICD-10-CM | POA: Diagnosis not present

## 2019-11-18 DIAGNOSIS — H269 Unspecified cataract: Secondary | ICD-10-CM | POA: Diagnosis not present

## 2019-11-19 DIAGNOSIS — H269 Unspecified cataract: Secondary | ICD-10-CM | POA: Diagnosis not present

## 2019-11-20 DIAGNOSIS — H269 Unspecified cataract: Secondary | ICD-10-CM | POA: Diagnosis not present

## 2019-11-21 DIAGNOSIS — H269 Unspecified cataract: Secondary | ICD-10-CM | POA: Diagnosis not present

## 2019-11-22 DIAGNOSIS — H269 Unspecified cataract: Secondary | ICD-10-CM | POA: Diagnosis not present

## 2019-11-23 DIAGNOSIS — H269 Unspecified cataract: Secondary | ICD-10-CM | POA: Diagnosis not present

## 2019-11-24 DIAGNOSIS — H269 Unspecified cataract: Secondary | ICD-10-CM | POA: Diagnosis not present

## 2019-11-25 DIAGNOSIS — H269 Unspecified cataract: Secondary | ICD-10-CM | POA: Diagnosis not present

## 2019-11-26 DIAGNOSIS — H269 Unspecified cataract: Secondary | ICD-10-CM | POA: Diagnosis not present

## 2019-11-27 DIAGNOSIS — H269 Unspecified cataract: Secondary | ICD-10-CM | POA: Diagnosis not present

## 2019-11-28 DIAGNOSIS — H269 Unspecified cataract: Secondary | ICD-10-CM | POA: Diagnosis not present

## 2019-11-29 DIAGNOSIS — H269 Unspecified cataract: Secondary | ICD-10-CM | POA: Diagnosis not present

## 2019-11-30 DIAGNOSIS — H269 Unspecified cataract: Secondary | ICD-10-CM | POA: Diagnosis not present

## 2019-12-01 DIAGNOSIS — H269 Unspecified cataract: Secondary | ICD-10-CM | POA: Diagnosis not present

## 2019-12-02 DIAGNOSIS — H269 Unspecified cataract: Secondary | ICD-10-CM | POA: Diagnosis not present

## 2019-12-03 DIAGNOSIS — H269 Unspecified cataract: Secondary | ICD-10-CM | POA: Diagnosis not present

## 2019-12-04 DIAGNOSIS — H269 Unspecified cataract: Secondary | ICD-10-CM | POA: Diagnosis not present

## 2019-12-05 DIAGNOSIS — H269 Unspecified cataract: Secondary | ICD-10-CM | POA: Diagnosis not present

## 2019-12-06 DIAGNOSIS — H269 Unspecified cataract: Secondary | ICD-10-CM | POA: Diagnosis not present

## 2019-12-07 DIAGNOSIS — H269 Unspecified cataract: Secondary | ICD-10-CM | POA: Diagnosis not present

## 2019-12-08 DIAGNOSIS — H269 Unspecified cataract: Secondary | ICD-10-CM | POA: Diagnosis not present

## 2019-12-09 DIAGNOSIS — H269 Unspecified cataract: Secondary | ICD-10-CM | POA: Diagnosis not present

## 2019-12-10 DIAGNOSIS — H269 Unspecified cataract: Secondary | ICD-10-CM | POA: Diagnosis not present

## 2019-12-11 DIAGNOSIS — H269 Unspecified cataract: Secondary | ICD-10-CM | POA: Diagnosis not present

## 2019-12-12 DIAGNOSIS — H269 Unspecified cataract: Secondary | ICD-10-CM | POA: Diagnosis not present

## 2019-12-13 DIAGNOSIS — H269 Unspecified cataract: Secondary | ICD-10-CM | POA: Diagnosis not present

## 2019-12-14 DIAGNOSIS — H269 Unspecified cataract: Secondary | ICD-10-CM | POA: Diagnosis not present

## 2019-12-15 DIAGNOSIS — H269 Unspecified cataract: Secondary | ICD-10-CM | POA: Diagnosis not present

## 2019-12-16 DIAGNOSIS — H269 Unspecified cataract: Secondary | ICD-10-CM | POA: Diagnosis not present

## 2019-12-17 DIAGNOSIS — H269 Unspecified cataract: Secondary | ICD-10-CM | POA: Diagnosis not present

## 2019-12-18 DIAGNOSIS — H269 Unspecified cataract: Secondary | ICD-10-CM | POA: Diagnosis not present

## 2019-12-19 DIAGNOSIS — H269 Unspecified cataract: Secondary | ICD-10-CM | POA: Diagnosis not present

## 2019-12-20 DIAGNOSIS — H269 Unspecified cataract: Secondary | ICD-10-CM | POA: Diagnosis not present

## 2019-12-21 DIAGNOSIS — H269 Unspecified cataract: Secondary | ICD-10-CM | POA: Diagnosis not present

## 2019-12-22 DIAGNOSIS — H269 Unspecified cataract: Secondary | ICD-10-CM | POA: Diagnosis not present

## 2019-12-23 DIAGNOSIS — H269 Unspecified cataract: Secondary | ICD-10-CM | POA: Diagnosis not present

## 2019-12-24 DIAGNOSIS — H269 Unspecified cataract: Secondary | ICD-10-CM | POA: Diagnosis not present

## 2019-12-25 DIAGNOSIS — H269 Unspecified cataract: Secondary | ICD-10-CM | POA: Diagnosis not present

## 2019-12-26 DIAGNOSIS — H269 Unspecified cataract: Secondary | ICD-10-CM | POA: Diagnosis not present

## 2019-12-27 DIAGNOSIS — H269 Unspecified cataract: Secondary | ICD-10-CM | POA: Diagnosis not present

## 2019-12-28 DIAGNOSIS — H269 Unspecified cataract: Secondary | ICD-10-CM | POA: Diagnosis not present

## 2019-12-29 DIAGNOSIS — H269 Unspecified cataract: Secondary | ICD-10-CM | POA: Diagnosis not present

## 2019-12-30 DIAGNOSIS — H269 Unspecified cataract: Secondary | ICD-10-CM | POA: Diagnosis not present

## 2019-12-31 DIAGNOSIS — H269 Unspecified cataract: Secondary | ICD-10-CM | POA: Diagnosis not present

## 2020-01-01 DIAGNOSIS — H269 Unspecified cataract: Secondary | ICD-10-CM | POA: Diagnosis not present

## 2020-01-02 DIAGNOSIS — H269 Unspecified cataract: Secondary | ICD-10-CM | POA: Diagnosis not present

## 2020-01-03 DIAGNOSIS — H269 Unspecified cataract: Secondary | ICD-10-CM | POA: Diagnosis not present

## 2020-01-04 DIAGNOSIS — H269 Unspecified cataract: Secondary | ICD-10-CM | POA: Diagnosis not present

## 2020-01-05 DIAGNOSIS — H269 Unspecified cataract: Secondary | ICD-10-CM | POA: Diagnosis not present

## 2020-01-06 DIAGNOSIS — H269 Unspecified cataract: Secondary | ICD-10-CM | POA: Diagnosis not present

## 2020-01-07 DIAGNOSIS — H269 Unspecified cataract: Secondary | ICD-10-CM | POA: Diagnosis not present

## 2020-01-08 DIAGNOSIS — H269 Unspecified cataract: Secondary | ICD-10-CM | POA: Diagnosis not present

## 2020-01-09 ENCOUNTER — Other Ambulatory Visit (INDEPENDENT_AMBULATORY_CARE_PROVIDER_SITE_OTHER): Payer: Self-pay | Admitting: Internal Medicine

## 2020-01-09 DIAGNOSIS — H269 Unspecified cataract: Secondary | ICD-10-CM | POA: Diagnosis not present

## 2020-01-10 DIAGNOSIS — H269 Unspecified cataract: Secondary | ICD-10-CM | POA: Diagnosis not present

## 2020-01-11 DIAGNOSIS — H269 Unspecified cataract: Secondary | ICD-10-CM | POA: Diagnosis not present

## 2020-01-12 DIAGNOSIS — H269 Unspecified cataract: Secondary | ICD-10-CM | POA: Diagnosis not present

## 2020-01-13 ENCOUNTER — Ambulatory Visit (INDEPENDENT_AMBULATORY_CARE_PROVIDER_SITE_OTHER): Payer: Medicaid Other | Admitting: Nurse Practitioner

## 2020-01-13 ENCOUNTER — Encounter (INDEPENDENT_AMBULATORY_CARE_PROVIDER_SITE_OTHER): Payer: Self-pay | Admitting: Nurse Practitioner

## 2020-01-13 ENCOUNTER — Other Ambulatory Visit: Payer: Self-pay

## 2020-01-13 VITALS — BP 125/80 | HR 91 | Temp 97.2°F | Ht 71.0 in | Wt 185.6 lb

## 2020-01-13 DIAGNOSIS — I10 Essential (primary) hypertension: Secondary | ICD-10-CM | POA: Diagnosis not present

## 2020-01-13 DIAGNOSIS — E785 Hyperlipidemia, unspecified: Secondary | ICD-10-CM | POA: Diagnosis not present

## 2020-01-13 DIAGNOSIS — Z720 Tobacco use: Secondary | ICD-10-CM

## 2020-01-13 DIAGNOSIS — H269 Unspecified cataract: Secondary | ICD-10-CM | POA: Diagnosis not present

## 2020-01-13 DIAGNOSIS — E559 Vitamin D deficiency, unspecified: Secondary | ICD-10-CM

## 2020-01-13 NOTE — Progress Notes (Signed)
Subjective:  Patient ID: Drew Cruz, male    DOB: 02/01/1956  Age: 64 y.o. MRN: [id number]  CC:  Chief Complaint  Patient presents with  . Hypertension  . Follow-up    Vitamin D deficiency  . Hyperlipidemia      HPI  This patient comes in today for his chronic conditions as above.  He continues on his medications as prescribed.  He is on lisinopril daily, vitamin D3 supplement of 1000 IUs daily, and atorvastatin as well as full dose aspirin daily.  Last LDL was collected in August 2020 and it was 51.  He does have a history of a stroke.  He also smokes daily and drinks alcohol regularly.  He tells me he has been cutting back on intake of both.  He tells me he is smoking approximately half a pack a day now as opposed to 1 full pack per day.  He also tells me he is not drinking on a daily basis, but will drink beer intermittently.   Past Medical History:  Diagnosis Date  . Cataracts, bilateral   . Hypercholesteremia   . Hypertension   . Pulmonary emphysema determined by X-ray ([place]) 07/04/2017  . Stroke ([place])    04/2015. left sided facial weakness.  . Substance abuse ([place])       Family History  Problem Relation Age of Onset  . Cancer Mother   . Early death Father        murder  . Other Sister 2       meningitis with resultant brain damage  . Colon cancer Neg Hx     Social History   Social History Narrative   Has an aid - makes sure takes meds, cooks   Disabled - uncertain why   "cant take care of myself"   Lives with mother and disabled sister   Leisure- watch TV, goes to friends house   Social History   Tobacco Use  . Smoking status: Current Every Day Smoker    Packs/day: 0.50    Years: 53.00    Pack years: 26.50    Types: Cigarettes    Start date: 10/03/1965  . Smokeless tobacco: Never Used  . Tobacco comment: Has been unsuccessful wih quitting attempts previously  Substance Use Topics  . Alcohol use: Yes    Alcohol/week: 3.0 standard drinks    Types:  3 Cans of beer per week    Comment: sometimes drinks 40 oz or 2-3 bottles/cans of beers a day since age 84 or 58.     Current Meds  Medication Sig  . aspirin 325 MG tablet Take 1 tablet (325 mg total) by mouth daily.  Marland Kitchen atorvastatin (LIPITOR) 80 MG tablet TAKE 1 TABLET BY MOUTH EVERY DAY  . Cholecalciferol 25 MCG (1000 UT) CHEW Chew 1 tablet by mouth.  Marland Kitchen lisinopril (ZESTRIL) 20 MG tablet TAKE 1 TABLET BY MOUTH EVERY DAY    ROS:  Review of Systems  Constitutional: Negative for fever, malaise/fatigue and weight loss.  Eyes: Negative for blurred vision and double vision.  Respiratory: Negative for cough, shortness of breath and wheezing.   Cardiovascular: Negative for chest pain and palpitations.  Neurological: Negative for dizziness and headaches.     Objective:   Today's Vitals: BP 125/80 (BP Location: Left Arm, Patient Position: Sitting, Cuff Size: Normal)   Pulse 91   Temp (!) 97.2 F (36.2 C) (Temporal)   Ht '5\' 11"'  (1.803 m)   Wt 185 lb 9.6  oz (84.2 kg)   SpO2 93%   BMI 25.89 kg/m  Vitals with BMI 01/13/2020 09/09/2019 08/11/2017  Height '5\' 11"'  '5\' 11"'  '5\' 10"'   Weight 185 lbs 10 oz 180 lbs 221 lbs  BMI 25.9 72.15 87.27  Systolic 618 (No Data) 485  Diastolic 80 (No Data) 80  Pulse 91 - 84     Physical Exam Vitals reviewed.  Constitutional:      Appearance: Normal appearance.  HENT:     Head: Normocephalic and atraumatic.  Cardiovascular:     Rate and Rhythm: Normal rate and regular rhythm.  Pulmonary:     Effort: Pulmonary effort is normal.     Breath sounds: Normal breath sounds.  Musculoskeletal:     Cervical back: Neck supple.  Skin:    General: Skin is warm and dry.  Neurological:     Mental Status: He is alert and oriented to person, place, and time.  Psychiatric:        Mood and Affect: Mood normal.        Behavior: Behavior normal.        Thought Content: Thought content normal.        Judgment: Judgment normal.          Assessment and Plan     1. Essential hypertension   2. Tobacco abuse   3. Hyperlipidemia, unspecified hyperlipidemia type   4. Vitamin D deficiency      Plan: 1.,  3.-4.  He will continue on his current medication regimen as prescribed.  I will collect blood work today for further evaluation.  2.  He was encouraged to reduce his intake of cigarette smoking as much as possible.   Tests ordered Orders Placed This Encounter  Procedures  . Lipid Panel  . Hemoglobin A1c  . CMP with eGFR([facility])  . Vitamin D, 25-hydroxy      No orders of the defined types were placed in this encounter.   Patient to follow-up in 3 months or sooner as needed.  Ailene Ards, NP

## 2020-01-14 ENCOUNTER — Encounter (INDEPENDENT_AMBULATORY_CARE_PROVIDER_SITE_OTHER): Payer: Self-pay | Admitting: Nurse Practitioner

## 2020-01-14 DIAGNOSIS — H269 Unspecified cataract: Secondary | ICD-10-CM | POA: Diagnosis not present

## 2020-01-14 LAB — COMPLETE METABOLIC PANEL WITH GFR
AG Ratio: 1.3 (calc) (ref 1.0–2.5)
ALT: 51 U/L — ABNORMAL HIGH (ref 9–46)
AST: 48 U/L — ABNORMAL HIGH (ref 10–35)
Albumin: 4.1 g/dL (ref 3.6–5.1)
Alkaline phosphatase (APISO): 79 U/L (ref 35–144)
BUN: 15 mg/dL (ref 7–25)
CO2: 28 mmol/L (ref 20–32)
Calcium: 9.5 mg/dL (ref 8.6–10.3)
Chloride: 104 mmol/L (ref 98–110)
Creat: 0.85 mg/dL (ref 0.70–1.25)
GFR, Est African American: 107 mL/min/{1.73_m2} (ref >=60)
GFR, Est Non African American: 93 mL/min/{1.73_m2} (ref >=60)
Globulin: 3.1 g/dL (calc) (ref 1.9–3.7)
Glucose, Bld: 82 mg/dL (ref 65–99)
Potassium: 4 mmol/L (ref 3.5–5.3)
Sodium: 140 mmol/L (ref 135–146)
Total Bilirubin: 0.5 mg/dL (ref 0.2–1.2)
Total Protein: 7.2 g/dL (ref 6.1–8.1)

## 2020-01-14 LAB — LIPID PANEL
Cholesterol: 153 mg/dL (ref <200)
HDL: 92 mg/dL (ref >=40)
LDL Cholesterol (Calc): 36 mg/dL (calc)
Non-HDL Cholesterol (Calc): 61 mg/dL (calc) (ref <130)
Total CHOL/HDL Ratio: 1.7 (calc) (ref <5.0)
Triglycerides: 173 mg/dL — ABNORMAL HIGH (ref <150)

## 2020-01-14 LAB — VITAMIN D 25 HYDROXY (VIT D DEFICIENCY, FRACTURES): Vit D, 25-Hydroxy: 40 ng/mL (ref 30–100)

## 2020-01-15 DIAGNOSIS — H269 Unspecified cataract: Secondary | ICD-10-CM | POA: Diagnosis not present

## 2020-01-16 DIAGNOSIS — H269 Unspecified cataract: Secondary | ICD-10-CM | POA: Diagnosis not present

## 2020-01-17 DIAGNOSIS — H269 Unspecified cataract: Secondary | ICD-10-CM | POA: Diagnosis not present

## 2020-01-18 DIAGNOSIS — H269 Unspecified cataract: Secondary | ICD-10-CM | POA: Diagnosis not present

## 2020-01-19 DIAGNOSIS — H269 Unspecified cataract: Secondary | ICD-10-CM | POA: Diagnosis not present

## 2020-01-20 DIAGNOSIS — H269 Unspecified cataract: Secondary | ICD-10-CM | POA: Diagnosis not present

## 2020-01-21 DIAGNOSIS — H269 Unspecified cataract: Secondary | ICD-10-CM | POA: Diagnosis not present

## 2020-01-22 DIAGNOSIS — H269 Unspecified cataract: Secondary | ICD-10-CM | POA: Diagnosis not present

## 2020-01-23 ENCOUNTER — Ambulatory Visit: Payer: Medicaid Other | Attending: Internal Medicine

## 2020-01-23 DIAGNOSIS — Z23 Encounter for immunization: Secondary | ICD-10-CM

## 2020-01-23 DIAGNOSIS — H269 Unspecified cataract: Secondary | ICD-10-CM | POA: Diagnosis not present

## 2020-01-23 NOTE — Progress Notes (Signed)
   Covid-19 Vaccination Clinic  Name:  Drew Cruz    MRN: [id number] DOB: 10/19/1955  01/23/2020  Mr. Drew Cruz was observed post Covid-19 immunization for 15 minutes without incident. He was provided with Vaccine Information Sheet and instruction to access the V-Safe system.   Mr. Drew Cruz was instructed to call 911 with any severe reactions post vaccine: Marland Kitchen Difficulty breathing  . Swelling of face and throat  . A fast heartbeat  . A bad rash all over body  . Dizziness and weakness   Immunizations Administered    Name Date Dose VIS Date Route   Moderna COVID-19 Vaccine 01/23/2020  8:54 AM 0.5 mL 09/2019 Intramuscular   Manufacturer: Moderna   Lot: [id number]   [id number]

## 2020-01-24 DIAGNOSIS — H269 Unspecified cataract: Secondary | ICD-10-CM | POA: Diagnosis not present

## 2020-01-25 DIAGNOSIS — H269 Unspecified cataract: Secondary | ICD-10-CM | POA: Diagnosis not present

## 2020-01-26 DIAGNOSIS — H269 Unspecified cataract: Secondary | ICD-10-CM | POA: Diagnosis not present

## 2020-01-27 DIAGNOSIS — H269 Unspecified cataract: Secondary | ICD-10-CM | POA: Diagnosis not present

## 2020-01-28 DIAGNOSIS — H269 Unspecified cataract: Secondary | ICD-10-CM | POA: Diagnosis not present

## 2020-01-29 DIAGNOSIS — H269 Unspecified cataract: Secondary | ICD-10-CM | POA: Diagnosis not present

## 2020-01-30 DIAGNOSIS — H269 Unspecified cataract: Secondary | ICD-10-CM | POA: Diagnosis not present

## 2020-01-31 DIAGNOSIS — H269 Unspecified cataract: Secondary | ICD-10-CM | POA: Diagnosis not present

## 2020-02-01 DIAGNOSIS — H269 Unspecified cataract: Secondary | ICD-10-CM | POA: Diagnosis not present

## 2020-02-02 DIAGNOSIS — H269 Unspecified cataract: Secondary | ICD-10-CM | POA: Diagnosis not present

## 2020-02-03 DIAGNOSIS — H269 Unspecified cataract: Secondary | ICD-10-CM | POA: Diagnosis not present

## 2020-02-04 DIAGNOSIS — H269 Unspecified cataract: Secondary | ICD-10-CM | POA: Diagnosis not present

## 2020-02-05 DIAGNOSIS — H269 Unspecified cataract: Secondary | ICD-10-CM | POA: Diagnosis not present

## 2020-02-06 DIAGNOSIS — H269 Unspecified cataract: Secondary | ICD-10-CM | POA: Diagnosis not present

## 2020-02-07 DIAGNOSIS — H269 Unspecified cataract: Secondary | ICD-10-CM | POA: Diagnosis not present

## 2020-02-08 DIAGNOSIS — H269 Unspecified cataract: Secondary | ICD-10-CM | POA: Diagnosis not present

## 2020-02-09 DIAGNOSIS — H269 Unspecified cataract: Secondary | ICD-10-CM | POA: Diagnosis not present

## 2020-02-10 DIAGNOSIS — H269 Unspecified cataract: Secondary | ICD-10-CM | POA: Diagnosis not present

## 2020-02-11 DIAGNOSIS — H269 Unspecified cataract: Secondary | ICD-10-CM | POA: Diagnosis not present

## 2020-02-12 DIAGNOSIS — H269 Unspecified cataract: Secondary | ICD-10-CM | POA: Diagnosis not present

## 2020-02-13 DIAGNOSIS — H269 Unspecified cataract: Secondary | ICD-10-CM | POA: Diagnosis not present

## 2020-02-14 DIAGNOSIS — H269 Unspecified cataract: Secondary | ICD-10-CM | POA: Diagnosis not present

## 2020-02-15 DIAGNOSIS — H269 Unspecified cataract: Secondary | ICD-10-CM | POA: Diagnosis not present

## 2020-02-16 DIAGNOSIS — H269 Unspecified cataract: Secondary | ICD-10-CM | POA: Diagnosis not present

## 2020-02-17 DIAGNOSIS — H269 Unspecified cataract: Secondary | ICD-10-CM | POA: Diagnosis not present

## 2020-02-18 DIAGNOSIS — H269 Unspecified cataract: Secondary | ICD-10-CM | POA: Diagnosis not present

## 2020-02-19 DIAGNOSIS — H269 Unspecified cataract: Secondary | ICD-10-CM | POA: Diagnosis not present

## 2020-02-20 DIAGNOSIS — H269 Unspecified cataract: Secondary | ICD-10-CM | POA: Diagnosis not present

## 2020-02-21 DIAGNOSIS — H269 Unspecified cataract: Secondary | ICD-10-CM | POA: Diagnosis not present

## 2020-02-22 DIAGNOSIS — H269 Unspecified cataract: Secondary | ICD-10-CM | POA: Diagnosis not present

## 2020-02-23 DIAGNOSIS — H269 Unspecified cataract: Secondary | ICD-10-CM | POA: Diagnosis not present

## 2020-02-24 DIAGNOSIS — H269 Unspecified cataract: Secondary | ICD-10-CM | POA: Diagnosis not present

## 2020-02-25 ENCOUNTER — Ambulatory Visit: Payer: Medicaid Other | Attending: Internal Medicine

## 2020-02-25 ENCOUNTER — Ambulatory Visit: Payer: Medicaid Other

## 2020-02-25 ENCOUNTER — Other Ambulatory Visit: Payer: Self-pay

## 2020-02-25 DIAGNOSIS — H269 Unspecified cataract: Secondary | ICD-10-CM | POA: Diagnosis not present

## 2020-02-25 DIAGNOSIS — Z23 Encounter for immunization: Secondary | ICD-10-CM

## 2020-02-25 NOTE — Progress Notes (Signed)
   Covid-19 Vaccination Clinic  Name:  Drew Cruz    MRN: [id number] DOB: [birth year]  02/25/2020  Mr. Drew Cruz was observed post Covid-19 immunization for 15 minutes without incident. He was provided with Vaccine Information Sheet and instruction to access the V-Safe system.   Mr. Drew Cruz was instructed to call 911 with any severe reactions post vaccine: Marland Kitchen Difficulty breathing  . Swelling of face and throat  . A fast heartbeat  . A bad rash all over body  . Dizziness and weakness   Immunizations Administered    Name Date Dose VIS Date Route   Moderna COVID-19 Vaccine 02/25/2020  9:04 AM 0.5 mL 09/2019 Intramuscular   Manufacturer: Moderna   Lot: [id number]   [id number]

## 2020-02-26 DIAGNOSIS — H269 Unspecified cataract: Secondary | ICD-10-CM | POA: Diagnosis not present

## 2020-02-27 DIAGNOSIS — H269 Unspecified cataract: Secondary | ICD-10-CM | POA: Diagnosis not present

## 2020-02-28 DIAGNOSIS — H269 Unspecified cataract: Secondary | ICD-10-CM | POA: Diagnosis not present

## 2020-02-29 DIAGNOSIS — H269 Unspecified cataract: Secondary | ICD-10-CM | POA: Diagnosis not present

## 2020-03-01 DIAGNOSIS — H269 Unspecified cataract: Secondary | ICD-10-CM | POA: Diagnosis not present

## 2020-03-02 DIAGNOSIS — H269 Unspecified cataract: Secondary | ICD-10-CM | POA: Diagnosis not present

## 2020-03-03 DIAGNOSIS — H269 Unspecified cataract: Secondary | ICD-10-CM | POA: Diagnosis not present

## 2020-03-04 DIAGNOSIS — H269 Unspecified cataract: Secondary | ICD-10-CM | POA: Diagnosis not present

## 2020-03-05 DIAGNOSIS — H269 Unspecified cataract: Secondary | ICD-10-CM | POA: Diagnosis not present

## 2020-03-06 DIAGNOSIS — H269 Unspecified cataract: Secondary | ICD-10-CM | POA: Diagnosis not present

## 2020-03-07 DIAGNOSIS — H269 Unspecified cataract: Secondary | ICD-10-CM | POA: Diagnosis not present

## 2020-03-08 DIAGNOSIS — H269 Unspecified cataract: Secondary | ICD-10-CM | POA: Diagnosis not present

## 2020-03-09 DIAGNOSIS — H269 Unspecified cataract: Secondary | ICD-10-CM | POA: Diagnosis not present

## 2020-03-10 DIAGNOSIS — H269 Unspecified cataract: Secondary | ICD-10-CM | POA: Diagnosis not present

## 2020-03-11 DIAGNOSIS — H269 Unspecified cataract: Secondary | ICD-10-CM | POA: Diagnosis not present

## 2020-03-12 DIAGNOSIS — H269 Unspecified cataract: Secondary | ICD-10-CM | POA: Diagnosis not present

## 2020-03-13 DIAGNOSIS — H269 Unspecified cataract: Secondary | ICD-10-CM | POA: Diagnosis not present

## 2020-03-14 DIAGNOSIS — H269 Unspecified cataract: Secondary | ICD-10-CM | POA: Diagnosis not present

## 2020-03-15 DIAGNOSIS — H269 Unspecified cataract: Secondary | ICD-10-CM | POA: Diagnosis not present

## 2020-03-16 DIAGNOSIS — H269 Unspecified cataract: Secondary | ICD-10-CM | POA: Diagnosis not present

## 2020-03-17 DIAGNOSIS — H269 Unspecified cataract: Secondary | ICD-10-CM | POA: Diagnosis not present

## 2020-03-18 DIAGNOSIS — H269 Unspecified cataract: Secondary | ICD-10-CM | POA: Diagnosis not present

## 2020-03-19 DIAGNOSIS — H269 Unspecified cataract: Secondary | ICD-10-CM | POA: Diagnosis not present

## 2020-03-20 DIAGNOSIS — H269 Unspecified cataract: Secondary | ICD-10-CM | POA: Diagnosis not present

## 2020-03-21 DIAGNOSIS — H269 Unspecified cataract: Secondary | ICD-10-CM | POA: Diagnosis not present

## 2020-03-22 DIAGNOSIS — H269 Unspecified cataract: Secondary | ICD-10-CM | POA: Diagnosis not present

## 2020-03-23 DIAGNOSIS — H269 Unspecified cataract: Secondary | ICD-10-CM | POA: Diagnosis not present

## 2020-03-24 DIAGNOSIS — H269 Unspecified cataract: Secondary | ICD-10-CM | POA: Diagnosis not present

## 2020-03-25 DIAGNOSIS — H269 Unspecified cataract: Secondary | ICD-10-CM | POA: Diagnosis not present

## 2020-03-26 DIAGNOSIS — H269 Unspecified cataract: Secondary | ICD-10-CM | POA: Diagnosis not present

## 2020-03-27 DIAGNOSIS — H269 Unspecified cataract: Secondary | ICD-10-CM | POA: Diagnosis not present

## 2020-03-28 DIAGNOSIS — H269 Unspecified cataract: Secondary | ICD-10-CM | POA: Diagnosis not present

## 2020-03-29 DIAGNOSIS — H269 Unspecified cataract: Secondary | ICD-10-CM | POA: Diagnosis not present

## 2020-03-30 DIAGNOSIS — H269 Unspecified cataract: Secondary | ICD-10-CM | POA: Diagnosis not present

## 2020-03-31 DIAGNOSIS — H269 Unspecified cataract: Secondary | ICD-10-CM | POA: Diagnosis not present

## 2020-04-01 DIAGNOSIS — H269 Unspecified cataract: Secondary | ICD-10-CM | POA: Diagnosis not present

## 2020-04-02 DIAGNOSIS — H269 Unspecified cataract: Secondary | ICD-10-CM | POA: Diagnosis not present

## 2020-04-03 DIAGNOSIS — H269 Unspecified cataract: Secondary | ICD-10-CM | POA: Diagnosis not present

## 2020-04-04 ENCOUNTER — Other Ambulatory Visit (INDEPENDENT_AMBULATORY_CARE_PROVIDER_SITE_OTHER): Payer: Self-pay | Admitting: Nurse Practitioner

## 2020-04-04 DIAGNOSIS — H269 Unspecified cataract: Secondary | ICD-10-CM | POA: Diagnosis not present

## 2020-04-05 DIAGNOSIS — H269 Unspecified cataract: Secondary | ICD-10-CM | POA: Diagnosis not present

## 2020-04-06 DIAGNOSIS — H269 Unspecified cataract: Secondary | ICD-10-CM | POA: Diagnosis not present

## 2020-04-07 DIAGNOSIS — H269 Unspecified cataract: Secondary | ICD-10-CM | POA: Diagnosis not present

## 2020-04-08 DIAGNOSIS — H269 Unspecified cataract: Secondary | ICD-10-CM | POA: Diagnosis not present

## 2020-04-09 DIAGNOSIS — H269 Unspecified cataract: Secondary | ICD-10-CM | POA: Diagnosis not present

## 2020-04-10 DIAGNOSIS — H269 Unspecified cataract: Secondary | ICD-10-CM | POA: Diagnosis not present

## 2020-04-11 DIAGNOSIS — H269 Unspecified cataract: Secondary | ICD-10-CM | POA: Diagnosis not present

## 2020-04-12 DIAGNOSIS — H269 Unspecified cataract: Secondary | ICD-10-CM | POA: Diagnosis not present

## 2020-04-13 DIAGNOSIS — H269 Unspecified cataract: Secondary | ICD-10-CM | POA: Diagnosis not present

## 2020-04-14 ENCOUNTER — Ambulatory Visit (INDEPENDENT_AMBULATORY_CARE_PROVIDER_SITE_OTHER): Payer: Medicaid Other | Admitting: Internal Medicine

## 2020-04-14 ENCOUNTER — Other Ambulatory Visit: Payer: Self-pay

## 2020-04-14 ENCOUNTER — Encounter (INDEPENDENT_AMBULATORY_CARE_PROVIDER_SITE_OTHER): Payer: Self-pay | Admitting: Internal Medicine

## 2020-04-14 VITALS — BP 130/90 | HR 83 | Temp 97.8°F | Ht 71.0 in | Wt 179.6 lb

## 2020-04-14 DIAGNOSIS — I10 Essential (primary) hypertension: Secondary | ICD-10-CM

## 2020-04-14 DIAGNOSIS — E785 Hyperlipidemia, unspecified: Secondary | ICD-10-CM | POA: Diagnosis not present

## 2020-04-14 DIAGNOSIS — E559 Vitamin D deficiency, unspecified: Secondary | ICD-10-CM | POA: Diagnosis not present

## 2020-04-14 DIAGNOSIS — F1010 Alcohol abuse, uncomplicated: Secondary | ICD-10-CM

## 2020-04-14 DIAGNOSIS — H269 Unspecified cataract: Secondary | ICD-10-CM | POA: Diagnosis not present

## 2020-04-14 NOTE — Progress Notes (Signed)
Metrics: Intervention Frequency ACO  Documented Smoking Status Yearly  Screened one or more times in 24 months  Cessation Counseling or  Active cessation medication Past 24 months  Past 24 months   Guideline developer: UpToDate (See UpToDate for funding source) Date Released: 2014       Wellness Office Visit  Subjective:  Patient ID: Drew Cruz, male    DOB: 20-Oct-1955  Age: 64 y.o. MRN: [id number]  CC: This man comes in for follow-up of hypertension, hyperlipidemia, vitamin D deficiency. HPI  He also continues to smoke cigarettes but he says he has cut back.  When I questioned him about his alcohol intake, he says he drinks 212 ounce beers every night.  His liver enzymes were elevated on the last visit. He continues to be compliant with taking lisinopril as prescribed. He also continues to take atorvastatin for his hyperlipidemia as prescribed. He denies any chest pain, dyspnea, palpitations or limb weakness. He has had a previous history of stroke. Past Medical History:  Diagnosis Date  . Cataracts, bilateral   . Hypercholesteremia   . Hypertension   . Pulmonary emphysema determined by X-ray ([place]) 07/04/2017  . Stroke ([place])    04/2015. left sided facial weakness.  . Substance abuse [facility])    Past Surgical History:  Procedure Laterality Date  . CATARACT EXTRACTION W/PHACO Right 05/25/2015   Procedure: CATARACT EXTRACTION PHACO AND INTRAOCULAR LENS PLACEMENT RIGHT EYE CDE=16.98;  Surgeon: Tonny Branch, MD;  Location: [facility] ORS;  Service: Ophthalmology;  Laterality: Right;  . CATARACT EXTRACTION W/PHACO Left 06/25/2015   Procedure: CATARACT EXTRACTION PHACO AND INTRAOCULAR LENS PLACEMENT (IOC);  Surgeon: Tonny Branch, MD;  Location: [facility] ORS;  Service: Ophthalmology;  Laterality: Left;  CDE: 17.56  . COLONOSCOPY WITH PROPOFOL N/A 11/24/2015   Procedure: COLONOSCOPY WITH PROPOFOL;  Surgeon: Danie Binder, MD;  Location: [facility] END[facility] SU[facility];  Service: Endoscopy;  Laterality: N/A;  4431  . POLYPECTOMY   11/24/2015   Procedure: POLYPECTOMY;  Surgeon: Danie Binder, MD;  Location: [facility];  Service: Endoscopy;;  rectal polyp     Family History  Problem Relation Age of Onset  . Cancer Mother   . Early death Father        murder  . Other Sister 2       meningitis with resultant brain damage  . Colon cancer Neg Hx     Social History   Social History Narrative   Has an aid - makes sure takes meds, cooks   Disabled - uncertain why   "cant take care of myself"   Lives with mother and disabled sister   Leisure- watch TV, goes to friends house   Social History   Tobacco Use  . Smoking status: Current Every Day Smoker    Packs/day: 0.50    Years: 53.00    Pack years: 26.50    Types: Cigarettes    Start date: 10/03/1965  . Smokeless tobacco: Never Used  . Tobacco comment: Has been unsuccessful wih quitting attempts previously  Substance Use Topics  . Alcohol use: Yes    Alcohol/week: 3.0 standard drinks    Types: 3 Cans of beer per week    Comment: sometimes drinks 40 oz or 2-3 bottles/cans of beers a day since age 18 or 21.    Current Meds  Medication Sig  . aspirin 325 MG tablet Take 1 tablet (325 mg total) by mouth daily.  Marland Kitchen atorvastatin (LIPITOR) 80 MG tablet TAKE 1 TABLET BY MOUTH  EVERY DAY  . Cholecalciferol 25 MCG (1000 UT) CHEW Chew 1 tablet by mouth.  Marland Kitchen lisinopril (ZESTRIL) 20 MG tablet TAKE 1 TABLET BY MOUTH EVERY DAY       Depression screen [facility] 2/9 01/13/2020 08/11/2017 06/09/2017  Decreased Interest 0 0 0  Down, Depressed, Hopeless 0 0 0  PHQ - 2 Score 0 0 0     Objective:   Today's Vitals: BP 130/90 (BP Location: Left Arm, Patient Position: Sitting, Cuff Size: Normal)   Pulse 83   Temp 97.8 F (36.6 C) (Temporal)   Ht 5\' 11"  (1.803 m)   Wt 179 lb 9.6 oz (81.5 kg)   SpO2 96%   BMI 25.05 kg/m  Vitals with BMI 04/14/2020 01/13/2020 09/09/2019  Height 5\' 11"  5\' 11"  5\' 11"   Weight 179 lbs 10 oz 185 lbs 10 oz 180 lbs  BMI 25.06 59.1 63.84  Systolic  665 993 (No Data)  Diastolic 90 80 (No Data)  Pulse 83 91 -     Physical Exam  He looks systemically well.  Blood pressure reasonable, diastolic slightly elevated today.  He is alert and orientated without any focal neurological signs.     Assessment   1. Essential hypertension   2. Hyperlipidemia, unspecified hyperlipidemia type   3. Vitamin D deficiency   4. Alcohol abuse       Tests ordered Orders Placed This Encounter  Procedures  . COMPLETE METABOLIC PANEL WITH GFR     Plan: 1. He will continue with lisinopril for his hypertension which is reasonably well controlled.  I have told him to cut back on his alcohol intake which I am sure will help his hypertension. 2. He will continue with atorvastatin for his hyperlipidemia.  Previous lab work was acceptable. 3. He will continue with vitamin D 3 supplementation for vitamin D deficiency.  Blood work is ordered. 4. Further recommendations will depend on blood results and he will follow-up with Sarah in about 3 months time.   No orders of the defined types were placed in this encounter.   Doree Albee, MD

## 2020-04-15 DIAGNOSIS — H269 Unspecified cataract: Secondary | ICD-10-CM | POA: Diagnosis not present

## 2020-04-15 LAB — COMPLETE METABOLIC PANEL WITH GFR
AG Ratio: 1.6 (calc) (ref 1.0–2.5)
ALT: 23 U/L (ref 9–46)
AST: 18 U/L (ref 10–35)
Albumin: 4.3 g/dL (ref 3.6–5.1)
Alkaline phosphatase (APISO): 61 U/L (ref 35–144)
BUN: 8 mg/dL (ref 7–25)
CO2: 25 mmol/L (ref 20–32)
Calcium: 9.4 mg/dL (ref 8.6–10.3)
Chloride: 105 mmol/L (ref 98–110)
Creat: 0.76 mg/dL (ref 0.70–1.25)
GFR, Est African American: 113 mL/min/{1.73_m2} (ref >=60)
GFR, Est Non African American: 97 mL/min/{1.73_m2} (ref >=60)
Globulin: 2.7 g/dL (calc) (ref 1.9–3.7)
Glucose, Bld: 83 mg/dL (ref 65–99)
Potassium: 3.7 mmol/L (ref 3.5–5.3)
Sodium: 140 mmol/L (ref 135–146)
Total Bilirubin: 0.4 mg/dL (ref 0.2–1.2)
Total Protein: 7 g/dL (ref 6.1–8.1)

## 2020-04-15 NOTE — Progress Notes (Signed)
Patient called.  Gave him his lab resutls. Also to keep f/u appt to see Judson Roch on 10/13.

## 2020-04-15 NOTE — Progress Notes (Signed)
Please call this patient and let him know that his blood tests are normal.  His liver enzymes are now normal also.  Follow-up as scheduled.

## 2020-04-16 DIAGNOSIS — H269 Unspecified cataract: Secondary | ICD-10-CM | POA: Diagnosis not present

## 2020-04-17 DIAGNOSIS — H269 Unspecified cataract: Secondary | ICD-10-CM | POA: Diagnosis not present

## 2020-04-18 DIAGNOSIS — H269 Unspecified cataract: Secondary | ICD-10-CM | POA: Diagnosis not present

## 2020-04-19 DIAGNOSIS — H269 Unspecified cataract: Secondary | ICD-10-CM | POA: Diagnosis not present

## 2020-04-20 DIAGNOSIS — H269 Unspecified cataract: Secondary | ICD-10-CM | POA: Diagnosis not present

## 2020-04-21 DIAGNOSIS — H269 Unspecified cataract: Secondary | ICD-10-CM | POA: Diagnosis not present

## 2020-04-22 DIAGNOSIS — H269 Unspecified cataract: Secondary | ICD-10-CM | POA: Diagnosis not present

## 2020-04-23 DIAGNOSIS — H269 Unspecified cataract: Secondary | ICD-10-CM | POA: Diagnosis not present

## 2020-04-24 DIAGNOSIS — H269 Unspecified cataract: Secondary | ICD-10-CM | POA: Diagnosis not present

## 2020-04-25 DIAGNOSIS — H269 Unspecified cataract: Secondary | ICD-10-CM | POA: Diagnosis not present

## 2020-04-26 DIAGNOSIS — H269 Unspecified cataract: Secondary | ICD-10-CM | POA: Diagnosis not present

## 2020-04-27 DIAGNOSIS — H269 Unspecified cataract: Secondary | ICD-10-CM | POA: Diagnosis not present

## 2020-04-28 DIAGNOSIS — H269 Unspecified cataract: Secondary | ICD-10-CM | POA: Diagnosis not present

## 2020-04-29 DIAGNOSIS — H269 Unspecified cataract: Secondary | ICD-10-CM | POA: Diagnosis not present

## 2020-04-30 DIAGNOSIS — H269 Unspecified cataract: Secondary | ICD-10-CM | POA: Diagnosis not present

## 2020-05-01 DIAGNOSIS — H269 Unspecified cataract: Secondary | ICD-10-CM | POA: Diagnosis not present

## 2020-05-02 DIAGNOSIS — H269 Unspecified cataract: Secondary | ICD-10-CM | POA: Diagnosis not present

## 2020-05-03 DIAGNOSIS — H269 Unspecified cataract: Secondary | ICD-10-CM | POA: Diagnosis not present

## 2020-05-04 DIAGNOSIS — H269 Unspecified cataract: Secondary | ICD-10-CM | POA: Diagnosis not present

## 2020-05-05 DIAGNOSIS — H269 Unspecified cataract: Secondary | ICD-10-CM | POA: Diagnosis not present

## 2020-05-06 DIAGNOSIS — H269 Unspecified cataract: Secondary | ICD-10-CM | POA: Diagnosis not present

## 2020-05-07 DIAGNOSIS — H269 Unspecified cataract: Secondary | ICD-10-CM | POA: Diagnosis not present

## 2020-05-08 DIAGNOSIS — H269 Unspecified cataract: Secondary | ICD-10-CM | POA: Diagnosis not present

## 2020-05-09 DIAGNOSIS — H269 Unspecified cataract: Secondary | ICD-10-CM | POA: Diagnosis not present

## 2020-05-10 DIAGNOSIS — H269 Unspecified cataract: Secondary | ICD-10-CM | POA: Diagnosis not present

## 2020-05-11 DIAGNOSIS — H269 Unspecified cataract: Secondary | ICD-10-CM | POA: Diagnosis not present

## 2020-05-12 DIAGNOSIS — H269 Unspecified cataract: Secondary | ICD-10-CM | POA: Diagnosis not present

## 2020-05-13 DIAGNOSIS — H269 Unspecified cataract: Secondary | ICD-10-CM | POA: Diagnosis not present

## 2020-05-14 DIAGNOSIS — H269 Unspecified cataract: Secondary | ICD-10-CM | POA: Diagnosis not present

## 2020-05-15 DIAGNOSIS — H269 Unspecified cataract: Secondary | ICD-10-CM | POA: Diagnosis not present

## 2020-05-16 DIAGNOSIS — H269 Unspecified cataract: Secondary | ICD-10-CM | POA: Diagnosis not present

## 2020-05-17 DIAGNOSIS — H269 Unspecified cataract: Secondary | ICD-10-CM | POA: Diagnosis not present

## 2020-05-18 DIAGNOSIS — H269 Unspecified cataract: Secondary | ICD-10-CM | POA: Diagnosis not present

## 2020-05-19 DIAGNOSIS — H269 Unspecified cataract: Secondary | ICD-10-CM | POA: Diagnosis not present

## 2020-05-20 DIAGNOSIS — H269 Unspecified cataract: Secondary | ICD-10-CM | POA: Diagnosis not present

## 2020-05-21 DIAGNOSIS — H269 Unspecified cataract: Secondary | ICD-10-CM | POA: Diagnosis not present

## 2020-05-22 DIAGNOSIS — H269 Unspecified cataract: Secondary | ICD-10-CM | POA: Diagnosis not present

## 2020-05-23 DIAGNOSIS — H269 Unspecified cataract: Secondary | ICD-10-CM | POA: Diagnosis not present

## 2020-05-24 DIAGNOSIS — H269 Unspecified cataract: Secondary | ICD-10-CM | POA: Diagnosis not present

## 2020-05-25 DIAGNOSIS — H269 Unspecified cataract: Secondary | ICD-10-CM | POA: Diagnosis not present

## 2020-05-26 DIAGNOSIS — H269 Unspecified cataract: Secondary | ICD-10-CM | POA: Diagnosis not present

## 2020-05-27 DIAGNOSIS — H269 Unspecified cataract: Secondary | ICD-10-CM | POA: Diagnosis not present

## 2020-05-28 DIAGNOSIS — H269 Unspecified cataract: Secondary | ICD-10-CM | POA: Diagnosis not present

## 2020-05-29 DIAGNOSIS — H269 Unspecified cataract: Secondary | ICD-10-CM | POA: Diagnosis not present

## 2020-05-30 DIAGNOSIS — H269 Unspecified cataract: Secondary | ICD-10-CM | POA: Diagnosis not present

## 2020-05-31 DIAGNOSIS — H269 Unspecified cataract: Secondary | ICD-10-CM | POA: Diagnosis not present

## 2020-06-01 DIAGNOSIS — H269 Unspecified cataract: Secondary | ICD-10-CM | POA: Diagnosis not present

## 2020-06-02 DIAGNOSIS — H269 Unspecified cataract: Secondary | ICD-10-CM | POA: Diagnosis not present

## 2020-06-03 DIAGNOSIS — H269 Unspecified cataract: Secondary | ICD-10-CM | POA: Diagnosis not present

## 2020-06-04 DIAGNOSIS — H269 Unspecified cataract: Secondary | ICD-10-CM | POA: Diagnosis not present

## 2020-06-05 DIAGNOSIS — H269 Unspecified cataract: Secondary | ICD-10-CM | POA: Diagnosis not present

## 2020-06-06 DIAGNOSIS — H269 Unspecified cataract: Secondary | ICD-10-CM | POA: Diagnosis not present

## 2020-06-07 DIAGNOSIS — H269 Unspecified cataract: Secondary | ICD-10-CM | POA: Diagnosis not present

## 2020-06-08 DIAGNOSIS — H269 Unspecified cataract: Secondary | ICD-10-CM | POA: Diagnosis not present

## 2020-06-09 DIAGNOSIS — H269 Unspecified cataract: Secondary | ICD-10-CM | POA: Diagnosis not present

## 2020-06-10 DIAGNOSIS — H269 Unspecified cataract: Secondary | ICD-10-CM | POA: Diagnosis not present

## 2020-06-11 DIAGNOSIS — H269 Unspecified cataract: Secondary | ICD-10-CM | POA: Diagnosis not present

## 2020-06-12 DIAGNOSIS — H269 Unspecified cataract: Secondary | ICD-10-CM | POA: Diagnosis not present

## 2020-06-13 DIAGNOSIS — H269 Unspecified cataract: Secondary | ICD-10-CM | POA: Diagnosis not present

## 2020-06-14 DIAGNOSIS — H269 Unspecified cataract: Secondary | ICD-10-CM | POA: Diagnosis not present

## 2020-06-15 DIAGNOSIS — H269 Unspecified cataract: Secondary | ICD-10-CM | POA: Diagnosis not present

## 2020-06-16 DIAGNOSIS — H269 Unspecified cataract: Secondary | ICD-10-CM | POA: Diagnosis not present

## 2020-06-17 DIAGNOSIS — H269 Unspecified cataract: Secondary | ICD-10-CM | POA: Diagnosis not present

## 2020-06-18 DIAGNOSIS — H269 Unspecified cataract: Secondary | ICD-10-CM | POA: Diagnosis not present

## 2020-06-19 DIAGNOSIS — H269 Unspecified cataract: Secondary | ICD-10-CM | POA: Diagnosis not present

## 2020-06-20 DIAGNOSIS — H269 Unspecified cataract: Secondary | ICD-10-CM | POA: Diagnosis not present

## 2020-06-21 DIAGNOSIS — H269 Unspecified cataract: Secondary | ICD-10-CM | POA: Diagnosis not present

## 2020-06-22 DIAGNOSIS — H269 Unspecified cataract: Secondary | ICD-10-CM | POA: Diagnosis not present

## 2020-06-23 DIAGNOSIS — H269 Unspecified cataract: Secondary | ICD-10-CM | POA: Diagnosis not present

## 2020-06-24 DIAGNOSIS — H269 Unspecified cataract: Secondary | ICD-10-CM | POA: Diagnosis not present

## 2020-06-25 DIAGNOSIS — H269 Unspecified cataract: Secondary | ICD-10-CM | POA: Diagnosis not present

## 2020-06-26 DIAGNOSIS — H269 Unspecified cataract: Secondary | ICD-10-CM | POA: Diagnosis not present

## 2020-06-27 DIAGNOSIS — H269 Unspecified cataract: Secondary | ICD-10-CM | POA: Diagnosis not present

## 2020-06-28 DIAGNOSIS — H269 Unspecified cataract: Secondary | ICD-10-CM | POA: Diagnosis not present

## 2020-06-29 DIAGNOSIS — H269 Unspecified cataract: Secondary | ICD-10-CM | POA: Diagnosis not present

## 2020-06-30 DIAGNOSIS — H269 Unspecified cataract: Secondary | ICD-10-CM | POA: Diagnosis not present

## 2020-07-01 DIAGNOSIS — H269 Unspecified cataract: Secondary | ICD-10-CM | POA: Diagnosis not present

## 2020-07-02 DIAGNOSIS — H269 Unspecified cataract: Secondary | ICD-10-CM | POA: Diagnosis not present

## 2020-07-03 DIAGNOSIS — H269 Unspecified cataract: Secondary | ICD-10-CM | POA: Diagnosis not present

## 2020-07-04 DIAGNOSIS — H269 Unspecified cataract: Secondary | ICD-10-CM | POA: Diagnosis not present

## 2020-07-05 DIAGNOSIS — H269 Unspecified cataract: Secondary | ICD-10-CM | POA: Diagnosis not present

## 2020-07-06 DIAGNOSIS — H269 Unspecified cataract: Secondary | ICD-10-CM | POA: Diagnosis not present

## 2020-07-07 DIAGNOSIS — H269 Unspecified cataract: Secondary | ICD-10-CM | POA: Diagnosis not present

## 2020-07-08 DIAGNOSIS — H269 Unspecified cataract: Secondary | ICD-10-CM | POA: Diagnosis not present

## 2020-07-09 DIAGNOSIS — H269 Unspecified cataract: Secondary | ICD-10-CM | POA: Diagnosis not present

## 2020-07-10 DIAGNOSIS — H269 Unspecified cataract: Secondary | ICD-10-CM | POA: Diagnosis not present

## 2020-07-11 DIAGNOSIS — H269 Unspecified cataract: Secondary | ICD-10-CM | POA: Diagnosis not present

## 2020-07-12 DIAGNOSIS — H269 Unspecified cataract: Secondary | ICD-10-CM | POA: Diagnosis not present

## 2020-07-13 DIAGNOSIS — H269 Unspecified cataract: Secondary | ICD-10-CM | POA: Diagnosis not present

## 2020-07-14 DIAGNOSIS — H269 Unspecified cataract: Secondary | ICD-10-CM | POA: Diagnosis not present

## 2020-07-15 ENCOUNTER — Encounter (INDEPENDENT_AMBULATORY_CARE_PROVIDER_SITE_OTHER): Payer: Self-pay | Admitting: Nurse Practitioner

## 2020-07-15 ENCOUNTER — Other Ambulatory Visit: Payer: Self-pay

## 2020-07-15 ENCOUNTER — Telehealth (INDEPENDENT_AMBULATORY_CARE_PROVIDER_SITE_OTHER): Payer: Self-pay | Admitting: Nurse Practitioner

## 2020-07-15 ENCOUNTER — Ambulatory Visit (INDEPENDENT_AMBULATORY_CARE_PROVIDER_SITE_OTHER): Payer: Medicaid Other | Admitting: Nurse Practitioner

## 2020-07-15 VITALS — BP 124/80 | HR 80 | Temp 97.3°F | Ht 71.0 in | Wt 182.3 lb

## 2020-07-15 DIAGNOSIS — Z23 Encounter for immunization: Secondary | ICD-10-CM | POA: Diagnosis not present

## 2020-07-15 DIAGNOSIS — Z122 Encounter for screening for malignant neoplasm of respiratory organs: Secondary | ICD-10-CM | POA: Diagnosis not present

## 2020-07-15 DIAGNOSIS — E559 Vitamin D deficiency, unspecified: Secondary | ICD-10-CM | POA: Diagnosis not present

## 2020-07-15 DIAGNOSIS — R059 Cough, unspecified: Secondary | ICD-10-CM | POA: Diagnosis not present

## 2020-07-15 DIAGNOSIS — H269 Unspecified cataract: Secondary | ICD-10-CM | POA: Diagnosis not present

## 2020-07-15 DIAGNOSIS — I10 Essential (primary) hypertension: Secondary | ICD-10-CM

## 2020-07-15 DIAGNOSIS — Z720 Tobacco use: Secondary | ICD-10-CM

## 2020-07-15 DIAGNOSIS — E785 Hyperlipidemia, unspecified: Secondary | ICD-10-CM

## 2020-07-15 NOTE — Progress Notes (Signed)
Subjective:  Patient ID: Drew Cruz, male    DOB: [birth year]  Age: 64 y.o. MRN: [id number]  CC:  Chief Complaint  Patient presents with   Follow-up    Doing okay, has a little cough   Other    Vitamin D deficiency, Cough   Hypertension   Hyperlipidemia      HPI  This patient arrives today for the above.  Vitamin D deficiency: Continues on his vitamin D3 supplement. Last serum level was 40 was collected earlier this year.  Chronic cough: He tells me he has an intermittent cough that is been going on for "years". He tells me he is also been on lisinopril for years, but does not remember associating the cough with starting his lisinopril. He does not cough with sometimes be worse at night but does not wake him up. He denies shortness of breath, sputum production, or wheezing. He does not have any history of asthma or childhood asthma. He did have lung cancer screening CT scan done completed in May 2020 and this was negative. He does continue to smoke. He tells me he has been smoking for about 54 years and averages smoked about 1 pack/day. This to make his pack year approximately 27.  Hypertension: He continues on lisinopril and is tolerating his medication well.  Hyperlipidemia: He continues on atorvastatin and aspirin. Last LDL was 36 and this was collected earlier this year.  Past Medical History:  Diagnosis Date   Cataracts, bilateral    Hypercholesteremia    Hypertension    Pulmonary emphysema determined by X-ray ([place]) 07/04/2017   Stroke ([place])    04/2015. left sided facial weakness.   Substance abuse ([place])       Family History  Problem Relation Age of Onset   Cancer Mother    Early death Father        murder   Other Sister 2       meningitis with resultant brain damage   Colon cancer Neg Hx     Social History   Social History Narrative   Has an aid - makes sure takes meds, cooks   Disabled - uncertain why   "cant take care of myself"   Lives  with mother and disabled sister   Leisure- watch TV, goes to friends house   Social History   Tobacco Use   Smoking status: Current Every Day Smoker    Packs/day: 0.50    Years: 53.00    Pack years: 26.50    Types: Cigarettes    Start date: 10/03/1965   Smokeless tobacco: Never Used   Tobacco comment: Has been unsuccessful wih quitting attempts previously  Substance Use Topics   Alcohol use: Yes    Alcohol/week: 3.0 standard drinks    Types: 3 Cans of beer per week    Comment: sometimes drinks 40 oz or 2-3 bottles/cans of beers a day since age 29 or 24.     Current Meds  Medication Sig   aspirin 325 MG tablet Take 1 tablet (325 mg total) by mouth daily.   atorvastatin (LIPITOR) 80 MG tablet TAKE 1 TABLET BY MOUTH EVERY DAY   Cholecalciferol 25 MCG (1000 UT) CHEW Chew 1 tablet by mouth.   lisinopril (ZESTRIL) 20 MG tablet TAKE 1 TABLET BY MOUTH EVERY DAY    ROS:  Review of Systems  Constitutional: Negative for fever.  Eyes: Negative for blurred vision.  Respiratory: Positive for cough. Negative for sputum production, shortness of  breath and wheezing.   Cardiovascular: Negative for chest pain.  Neurological: Negative for dizziness, loss of consciousness and headaches.     Objective:   Today's Vitals: BP 124/80    Pulse 80    Temp (!) 97.3 F (36.3 C) (Temporal)    Ht 5\' 11"  (1.803 m)    Wt 182 lb 4.8 oz (82.7 kg)    SpO2 98%    BMI 25.43 kg/m  Vitals with BMI 07/15/2020 04/14/2020 01/13/2020  Height 5\' 11"  5\' 11"  5\' 11"   Weight 182 lbs 5 oz 179 lbs 10 oz 185 lbs 10 oz  BMI 25.44 22.63 33.5  Systolic 456 256 389  Diastolic 80 90 80  Pulse 80 83 91     Physical Exam Vitals reviewed.  Constitutional:      Appearance: Normal appearance.  HENT:     Head: Normocephalic and atraumatic.  Cardiovascular:     Rate and Rhythm: Normal rate and regular rhythm.  Pulmonary:     Effort: Pulmonary effort is normal.     Breath sounds: Normal breath sounds.    Musculoskeletal:     Cervical back: Neck supple.  Skin:    General: Skin is warm and dry.  Neurological:     Mental Status: He is alert and oriented to person, place, and time.  Psychiatric:        Mood and Affect: Mood normal.        Behavior: Behavior normal.        Thought Content: Thought content normal.        Judgment: Judgment normal.          Assessment and Plan   1. Flu vaccine need   2. Cough   3. Encounter for screening for lung cancer   4. Vitamin D deficiency   5. Primary hypertension   6. Hyperlipidemia, unspecified hyperlipidemia type   7. Tobacco abuse      Plan: 1.  Flu vaccine administered today.  We did discuss having pneumonia vaccine administered today as well but he wants to hold off on this for now.  He has had PPSV 13 administered so he would be due for PPSV23. 2.,  3.,  7.  Will send for CT scan of the chest for further evaluation.  We did discuss possibly being evaluated by pulmonology to undergo formal testing for COPD.  He does not want to do this at this time.  May consider this in the future. 4.  He will continue on his vitamin D3 supplement. 5.  Continue on his antihypertensives. 6.  He will continue on his statin and full dose aspirin daily.   Tests ordered Orders Placed This Encounter  Procedures   Flu Vaccine QUAD 6+ mos PF IM (Fluarix Quad PF)      No orders of the defined types were placed in this encounter.   Patient to follow-up in 3 months or sooner as needed.  Ailene Ards, NP

## 2020-07-15 NOTE — Patient Instructions (Signed)

## 2020-07-15 NOTE — Telephone Encounter (Signed)
CT scan of chest ordered today. Please make sure this is run through insurance and scheduled.

## 2020-07-16 DIAGNOSIS — H269 Unspecified cataract: Secondary | ICD-10-CM | POA: Diagnosis not present

## 2020-07-17 DIAGNOSIS — H269 Unspecified cataract: Secondary | ICD-10-CM | POA: Diagnosis not present

## 2020-07-18 DIAGNOSIS — H269 Unspecified cataract: Secondary | ICD-10-CM | POA: Diagnosis not present

## 2020-07-19 DIAGNOSIS — H269 Unspecified cataract: Secondary | ICD-10-CM | POA: Diagnosis not present

## 2020-07-20 DIAGNOSIS — H269 Unspecified cataract: Secondary | ICD-10-CM | POA: Diagnosis not present

## 2020-07-20 NOTE — Telephone Encounter (Signed)
Pt has medicaid. It is submitted.

## 2020-07-21 DIAGNOSIS — H269 Unspecified cataract: Secondary | ICD-10-CM | POA: Diagnosis not present

## 2020-07-22 DIAGNOSIS — H269 Unspecified cataract: Secondary | ICD-10-CM | POA: Diagnosis not present

## 2020-07-23 DIAGNOSIS — H269 Unspecified cataract: Secondary | ICD-10-CM | POA: Diagnosis not present

## 2020-07-24 DIAGNOSIS — H269 Unspecified cataract: Secondary | ICD-10-CM | POA: Diagnosis not present

## 2020-07-25 DIAGNOSIS — H269 Unspecified cataract: Secondary | ICD-10-CM | POA: Diagnosis not present

## 2020-07-26 DIAGNOSIS — H269 Unspecified cataract: Secondary | ICD-10-CM | POA: Diagnosis not present

## 2020-07-27 DIAGNOSIS — H269 Unspecified cataract: Secondary | ICD-10-CM | POA: Diagnosis not present

## 2020-07-28 DIAGNOSIS — H269 Unspecified cataract: Secondary | ICD-10-CM | POA: Diagnosis not present

## 2020-07-29 DIAGNOSIS — H269 Unspecified cataract: Secondary | ICD-10-CM | POA: Diagnosis not present

## 2020-07-30 DIAGNOSIS — H269 Unspecified cataract: Secondary | ICD-10-CM | POA: Diagnosis not present

## 2020-07-31 DIAGNOSIS — H269 Unspecified cataract: Secondary | ICD-10-CM | POA: Diagnosis not present

## 2020-08-01 DIAGNOSIS — H269 Unspecified cataract: Secondary | ICD-10-CM | POA: Diagnosis not present

## 2020-08-02 DIAGNOSIS — H269 Unspecified cataract: Secondary | ICD-10-CM | POA: Diagnosis not present

## 2020-08-03 DIAGNOSIS — H269 Unspecified cataract: Secondary | ICD-10-CM | POA: Diagnosis not present

## 2020-08-04 DIAGNOSIS — H269 Unspecified cataract: Secondary | ICD-10-CM | POA: Diagnosis not present

## 2020-08-05 DIAGNOSIS — H269 Unspecified cataract: Secondary | ICD-10-CM | POA: Diagnosis not present

## 2020-08-06 DIAGNOSIS — H269 Unspecified cataract: Secondary | ICD-10-CM | POA: Diagnosis not present

## 2020-08-07 DIAGNOSIS — H269 Unspecified cataract: Secondary | ICD-10-CM | POA: Diagnosis not present

## 2020-08-08 DIAGNOSIS — H269 Unspecified cataract: Secondary | ICD-10-CM | POA: Diagnosis not present

## 2020-08-09 DIAGNOSIS — H269 Unspecified cataract: Secondary | ICD-10-CM | POA: Diagnosis not present

## 2020-08-10 DIAGNOSIS — H269 Unspecified cataract: Secondary | ICD-10-CM | POA: Diagnosis not present

## 2020-08-11 DIAGNOSIS — H269 Unspecified cataract: Secondary | ICD-10-CM | POA: Diagnosis not present

## 2020-08-12 DIAGNOSIS — H269 Unspecified cataract: Secondary | ICD-10-CM | POA: Diagnosis not present

## 2020-08-13 DIAGNOSIS — H269 Unspecified cataract: Secondary | ICD-10-CM | POA: Diagnosis not present

## 2020-08-14 DIAGNOSIS — H269 Unspecified cataract: Secondary | ICD-10-CM | POA: Diagnosis not present

## 2020-08-15 DIAGNOSIS — H269 Unspecified cataract: Secondary | ICD-10-CM | POA: Diagnosis not present

## 2020-08-16 DIAGNOSIS — H269 Unspecified cataract: Secondary | ICD-10-CM | POA: Diagnosis not present

## 2020-08-17 DIAGNOSIS — H269 Unspecified cataract: Secondary | ICD-10-CM | POA: Diagnosis not present

## 2020-08-18 DIAGNOSIS — H269 Unspecified cataract: Secondary | ICD-10-CM | POA: Diagnosis not present

## 2020-08-19 DIAGNOSIS — H269 Unspecified cataract: Secondary | ICD-10-CM | POA: Diagnosis not present

## 2020-08-20 DIAGNOSIS — H269 Unspecified cataract: Secondary | ICD-10-CM | POA: Diagnosis not present

## 2020-08-21 DIAGNOSIS — H269 Unspecified cataract: Secondary | ICD-10-CM | POA: Diagnosis not present

## 2020-08-22 DIAGNOSIS — H269 Unspecified cataract: Secondary | ICD-10-CM | POA: Diagnosis not present

## 2020-08-23 DIAGNOSIS — H269 Unspecified cataract: Secondary | ICD-10-CM | POA: Diagnosis not present

## 2020-08-24 DIAGNOSIS — H269 Unspecified cataract: Secondary | ICD-10-CM | POA: Diagnosis not present

## 2020-08-25 DIAGNOSIS — H269 Unspecified cataract: Secondary | ICD-10-CM | POA: Diagnosis not present

## 2020-08-26 DIAGNOSIS — H269 Unspecified cataract: Secondary | ICD-10-CM | POA: Diagnosis not present

## 2020-08-27 DIAGNOSIS — H269 Unspecified cataract: Secondary | ICD-10-CM | POA: Diagnosis not present

## 2020-08-28 DIAGNOSIS — H269 Unspecified cataract: Secondary | ICD-10-CM | POA: Diagnosis not present

## 2020-08-29 DIAGNOSIS — H269 Unspecified cataract: Secondary | ICD-10-CM | POA: Diagnosis not present

## 2020-08-30 DIAGNOSIS — H269 Unspecified cataract: Secondary | ICD-10-CM | POA: Diagnosis not present

## 2020-08-31 DIAGNOSIS — H269 Unspecified cataract: Secondary | ICD-10-CM | POA: Diagnosis not present

## 2020-09-01 DIAGNOSIS — H269 Unspecified cataract: Secondary | ICD-10-CM | POA: Diagnosis not present

## 2020-09-02 DIAGNOSIS — H269 Unspecified cataract: Secondary | ICD-10-CM | POA: Diagnosis not present

## 2020-09-03 DIAGNOSIS — H269 Unspecified cataract: Secondary | ICD-10-CM | POA: Diagnosis not present

## 2020-09-04 DIAGNOSIS — H269 Unspecified cataract: Secondary | ICD-10-CM | POA: Diagnosis not present

## 2020-09-05 DIAGNOSIS — H269 Unspecified cataract: Secondary | ICD-10-CM | POA: Diagnosis not present

## 2020-09-06 DIAGNOSIS — H269 Unspecified cataract: Secondary | ICD-10-CM | POA: Diagnosis not present

## 2020-09-07 DIAGNOSIS — H269 Unspecified cataract: Secondary | ICD-10-CM | POA: Diagnosis not present

## 2020-09-08 DIAGNOSIS — H269 Unspecified cataract: Secondary | ICD-10-CM | POA: Diagnosis not present

## 2020-09-09 DIAGNOSIS — H269 Unspecified cataract: Secondary | ICD-10-CM | POA: Diagnosis not present

## 2020-09-10 DIAGNOSIS — H269 Unspecified cataract: Secondary | ICD-10-CM | POA: Diagnosis not present

## 2020-09-11 DIAGNOSIS — H269 Unspecified cataract: Secondary | ICD-10-CM | POA: Diagnosis not present

## 2020-09-12 DIAGNOSIS — H269 Unspecified cataract: Secondary | ICD-10-CM | POA: Diagnosis not present

## 2020-09-13 DIAGNOSIS — H269 Unspecified cataract: Secondary | ICD-10-CM | POA: Diagnosis not present

## 2020-09-14 DIAGNOSIS — H269 Unspecified cataract: Secondary | ICD-10-CM | POA: Diagnosis not present

## 2020-09-15 DIAGNOSIS — H269 Unspecified cataract: Secondary | ICD-10-CM | POA: Diagnosis not present

## 2020-09-16 DIAGNOSIS — H269 Unspecified cataract: Secondary | ICD-10-CM | POA: Diagnosis not present

## 2020-09-17 DIAGNOSIS — H269 Unspecified cataract: Secondary | ICD-10-CM | POA: Diagnosis not present

## 2020-09-18 DIAGNOSIS — H269 Unspecified cataract: Secondary | ICD-10-CM | POA: Diagnosis not present

## 2020-09-19 DIAGNOSIS — H269 Unspecified cataract: Secondary | ICD-10-CM | POA: Diagnosis not present

## 2020-09-20 DIAGNOSIS — H269 Unspecified cataract: Secondary | ICD-10-CM | POA: Diagnosis not present

## 2020-09-21 DIAGNOSIS — H269 Unspecified cataract: Secondary | ICD-10-CM | POA: Diagnosis not present

## 2020-09-22 DIAGNOSIS — H269 Unspecified cataract: Secondary | ICD-10-CM | POA: Diagnosis not present

## 2020-09-23 DIAGNOSIS — H269 Unspecified cataract: Secondary | ICD-10-CM | POA: Diagnosis not present

## 2020-09-24 DIAGNOSIS — H269 Unspecified cataract: Secondary | ICD-10-CM | POA: Diagnosis not present

## 2020-09-25 DIAGNOSIS — H269 Unspecified cataract: Secondary | ICD-10-CM | POA: Diagnosis not present

## 2020-09-26 DIAGNOSIS — H269 Unspecified cataract: Secondary | ICD-10-CM | POA: Diagnosis not present

## 2020-09-27 DIAGNOSIS — H269 Unspecified cataract: Secondary | ICD-10-CM | POA: Diagnosis not present

## 2020-10-03 DIAGNOSIS — H269 Unspecified cataract: Secondary | ICD-10-CM | POA: Diagnosis not present

## 2020-10-04 DIAGNOSIS — H269 Unspecified cataract: Secondary | ICD-10-CM | POA: Diagnosis not present

## 2020-10-05 DIAGNOSIS — H269 Unspecified cataract: Secondary | ICD-10-CM | POA: Diagnosis not present

## 2020-10-06 DIAGNOSIS — H269 Unspecified cataract: Secondary | ICD-10-CM | POA: Diagnosis not present

## 2020-10-07 DIAGNOSIS — H269 Unspecified cataract: Secondary | ICD-10-CM | POA: Diagnosis not present

## 2020-10-08 DIAGNOSIS — H269 Unspecified cataract: Secondary | ICD-10-CM | POA: Diagnosis not present

## 2020-10-09 DIAGNOSIS — H269 Unspecified cataract: Secondary | ICD-10-CM | POA: Diagnosis not present

## 2020-10-10 DIAGNOSIS — H269 Unspecified cataract: Secondary | ICD-10-CM | POA: Diagnosis not present

## 2020-10-11 DIAGNOSIS — H269 Unspecified cataract: Secondary | ICD-10-CM | POA: Diagnosis not present

## 2020-10-12 DIAGNOSIS — H269 Unspecified cataract: Secondary | ICD-10-CM | POA: Diagnosis not present

## 2020-10-13 DIAGNOSIS — H269 Unspecified cataract: Secondary | ICD-10-CM | POA: Diagnosis not present

## 2020-10-14 DIAGNOSIS — H269 Unspecified cataract: Secondary | ICD-10-CM | POA: Diagnosis not present

## 2020-10-15 DIAGNOSIS — H269 Unspecified cataract: Secondary | ICD-10-CM | POA: Diagnosis not present

## 2020-10-16 DIAGNOSIS — H269 Unspecified cataract: Secondary | ICD-10-CM | POA: Diagnosis not present

## 2020-10-17 DIAGNOSIS — H269 Unspecified cataract: Secondary | ICD-10-CM | POA: Diagnosis not present

## 2020-10-18 DIAGNOSIS — H269 Unspecified cataract: Secondary | ICD-10-CM | POA: Diagnosis not present

## 2020-10-19 DIAGNOSIS — H269 Unspecified cataract: Secondary | ICD-10-CM | POA: Diagnosis not present

## 2020-10-20 DIAGNOSIS — H269 Unspecified cataract: Secondary | ICD-10-CM | POA: Diagnosis not present

## 2020-10-21 DIAGNOSIS — H269 Unspecified cataract: Secondary | ICD-10-CM | POA: Diagnosis not present

## 2020-10-22 ENCOUNTER — Telehealth (INDEPENDENT_AMBULATORY_CARE_PROVIDER_SITE_OTHER): Payer: Self-pay

## 2020-10-22 DIAGNOSIS — H269 Unspecified cataract: Secondary | ICD-10-CM | POA: Diagnosis not present

## 2020-10-22 NOTE — Telephone Encounter (Signed)
I attempted to call Caryl Pina back twice this afternoon for clarification. She did not answer, but I did leave a voicemail asking her to call back. Will try to connect with there next week.

## 2020-10-22 NOTE — Telephone Encounter (Signed)
Caryl Pina an Sales executive with [employer] [place] [employer] called and left a detailed voice message and stated that this patient receives in home personal care services [facility]) and this will run out this month and she needs a letter head or an Rx pad faxed with the following information to reinstate this for 2022 and will last for a year.  This is the information that is needed on the paper to fax:  1) PCS ([place]) provided per treatment 2) Code [phone] 3) Medical Diagnosis 4) Patients name and DOB 5) Signature from Judson Roch  Please let me know when this is complete and I will fax to the requested fax number of [phone]. Caryl Pina can be reached at [phone] if you have any questions.  Thank you!

## 2020-10-23 DIAGNOSIS — H269 Unspecified cataract: Secondary | ICD-10-CM | POA: Diagnosis not present

## 2020-10-24 DIAGNOSIS — H269 Unspecified cataract: Secondary | ICD-10-CM | POA: Diagnosis not present

## 2020-10-25 DIAGNOSIS — H269 Unspecified cataract: Secondary | ICD-10-CM | POA: Diagnosis not present

## 2020-10-26 ENCOUNTER — Ambulatory Visit (INDEPENDENT_AMBULATORY_CARE_PROVIDER_SITE_OTHER): Payer: Medicaid Other | Admitting: Internal Medicine

## 2020-10-26 ENCOUNTER — Encounter (INDEPENDENT_AMBULATORY_CARE_PROVIDER_SITE_OTHER): Payer: Self-pay | Admitting: Internal Medicine

## 2020-10-26 ENCOUNTER — Other Ambulatory Visit: Payer: Self-pay

## 2020-10-26 VITALS — BP 136/82 | HR 90 | Temp 97.7°F | Ht 71.0 in | Wt 185.0 lb

## 2020-10-26 DIAGNOSIS — J439 Emphysema, unspecified: Secondary | ICD-10-CM

## 2020-10-26 DIAGNOSIS — F1010 Alcohol abuse, uncomplicated: Secondary | ICD-10-CM

## 2020-10-26 DIAGNOSIS — I10 Essential (primary) hypertension: Secondary | ICD-10-CM | POA: Diagnosis not present

## 2020-10-26 DIAGNOSIS — E785 Hyperlipidemia, unspecified: Secondary | ICD-10-CM

## 2020-10-26 DIAGNOSIS — I700 Atherosclerosis of aorta: Secondary | ICD-10-CM

## 2020-10-26 DIAGNOSIS — H269 Unspecified cataract: Secondary | ICD-10-CM | POA: Diagnosis not present

## 2020-10-26 DIAGNOSIS — Z720 Tobacco use: Secondary | ICD-10-CM

## 2020-10-26 MED ORDER — LISINOPRIL 20 MG PO TABS: 20.0000 mg | ORAL_TABLET | Freq: Every day | ORAL | 1 refills | Status: DC

## 2020-10-26 MED ORDER — ATORVASTATIN CALCIUM 80 MG PO TABS: 80.0000 mg | ORAL_TABLET | Freq: Every day | ORAL | 1 refills | Status: DC

## 2020-10-26 NOTE — Progress Notes (Signed)
Metrics: Intervention Frequency ACO  Documented Smoking Status Yearly  Screened one or more times in 24 months  Cessation Counseling or  Active cessation medication Past 24 months  Past 24 months   Guideline developer: UpToDate (See UpToDate for funding source) Date Released: 2014       Wellness Office Visit  Subjective:  Patient ID: Drew Cruz, male    DOB: 1956-06-23  Age: 65 y.o. MRN: [id number]  CC: This man comes in for follow-up of hypertension, dyslipidemia, tobacco abuse and alcohol abuse. HPI  He has no specific complaints apart from a chronic cough which may be related to his smoking habit although is trying to reduce this.  He tells me he is trying to cut down alcohol consumption also. He continues with lisinopril for his hypertension and also continues with atorvastatin for his hyper lipidemia. He denies any chest pain, dyspnea, new limb weakness. Judson Roch had ordered a CT scan of his chest in view of his smoking history but this has not been done yet. Past Medical History:  Diagnosis Date  . Cataracts, bilateral   . Hypercholesteremia   . Hypertension   . Pulmonary emphysema determined by X-ray ([place]) 07/04/2017  . Stroke ([place])    04/2015. left sided facial weakness.  . Substance abuse [facility])    Past Surgical History:  Procedure Laterality Date  . CATARACT EXTRACTION W/PHACO Right 05/25/2015   Procedure: CATARACT EXTRACTION PHACO AND INTRAOCULAR LENS PLACEMENT RIGHT EYE CDE=16.98;  Surgeon: Tonny Branch, MD;  Location: [facility] ORS;  Service: Ophthalmology;  Laterality: Right;  . CATARACT EXTRACTION W/PHACO Left 06/25/2015   Procedure: CATARACT EXTRACTION PHACO AND INTRAOCULAR LENS PLACEMENT (IOC);  Surgeon: Tonny Branch, MD;  Location: [facility] ORS;  Service: Ophthalmology;  Laterality: Left;  CDE: 17.56  . COLONOSCOPY WITH PROPOFOL N/A 11/24/2015   Procedure: COLONOSCOPY WITH PROPOFOL;  Surgeon: Danie Binder, MD;  Location: [facility] ENDO SUITE;  Service: Endoscopy;  Laterality: N/A;  4098  .  POLYPECTOMY  11/24/2015   Procedure: POLYPECTOMY;  Surgeon: Danie Binder, MD;  Location: [facility];  Service: Endoscopy;;  rectal polyp     Family History  Problem Relation Age of Onset  . Cancer Mother   . Early death Father        murder  . Other Sister 2       meningitis with resultant brain damage  . Colon cancer Neg Hx     Social History   Social History Narrative   Has an aid - makes sure takes meds, cooks   Disabled - uncertain why   "cant take care of myself"   Lives with mother and disabled sister   Leisure- watch TV, goes to friends house   Social History   Tobacco Use  . Smoking status: Current Every Day Smoker    Packs/day: 0.50    Years: 53.00    Pack years: 26.50    Types: Cigarettes    Start date: 10/03/1965  . Smokeless tobacco: Never Used  . Tobacco comment: Has been unsuccessful wih quitting attempts previously  Substance Use Topics  . Alcohol use: Yes    Alcohol/week: 3.0 standard drinks    Types: 3 Cans of beer per week    Comment: sometimes drinks 40 oz or 2-3 bottles/cans of beers a day since age 54 or 1.    Current Meds  Medication Sig  . aspirin 325 MG tablet Take 1 tablet (325 mg total) by mouth daily.  . Cholecalciferol 25 MCG (1000  UT) CHEW Chew 1 tablet by mouth.  . [DISCONTINUED] atorvastatin (LIPITOR) 80 MG tablet TAKE 1 TABLET BY MOUTH EVERY DAY  . [DISCONTINUED] lisinopril (ZESTRIL) 20 MG tablet TAKE 1 TABLET BY MOUTH EVERY DAY      Depression screen [facility] 2/9 10/26/2020 01/13/2020 08/11/2017 06/09/2017  Decreased Interest 0 0 0 0  Down, Depressed, Hopeless 0 0 0 0  PHQ - 2 Score 0 0 0 0  Altered sleeping 0 - - -  Tired, decreased energy 0 - - -  Change in appetite 0 - - -  Feeling bad or failure about yourself  0 - - -  Trouble concentrating 0 - - -  Moving slowly or fidgety/restless 0 - - -  Suicidal thoughts 0 - - -  PHQ-9 Score 0 - - -  Difficult doing work/chores Not difficult at all - - -     Objective:   Today's  Vitals: BP 136/82   Pulse 90   Temp 97.7 F (36.5 C) (Temporal)   Ht 5\' 11"  (1.803 m)   Wt 185 lb (83.9 kg)   SpO2 96%   BMI 25.80 kg/m  Vitals with BMI 10/26/2020 07/15/2020 04/14/2020  Height 5\' 11"  5\' 11"  5\' 11"   Weight 185 lbs 182 lbs 5 oz 179 lbs 10 oz  BMI 25.81 29.51 88.41  Systolic 660 630 160  Diastolic 82 80 90  Pulse 90 80 83     Physical Exam   Blood pressure is acceptable.  No other new physical findings today.    Assessment   1. Tobacco abuse   2. Essential hypertension   3. Alcohol abuse   4. Pulmonary emphysema determined by X-ray ([place]) Chronic  5. Aortic atherosclerosis ([facility]) Chronic  6. Hyperlipidemia, unspecified hyperlipidemia type       Tests ordered No orders of the defined types were placed in this encounter.    Plan: 1. He will continue with the same dose of lisinopril for his hypertension. 2. He will continue with statin therapy for his dyslipidemia. 3. He will continue to work on reduction in tobacco and alcohol consumptions. 4. I will check on that CT scan of the chest to make sure this is done. 5. Follow-up with Judson Roch in about 4 months.   Meds ordered this encounter  Medications  . atorvastatin (LIPITOR) 80 MG tablet    Sig: Take 1 tablet (80 mg total) by mouth daily.    Dispense:  90 tablet    Refill:  1  . lisinopril (ZESTRIL) 20 MG tablet    Sig: Take 1 tablet (20 mg total) by mouth daily.    Dispense:  90 tablet    Refill:  1    Armanii Pressnell Luther Parody, MD

## 2020-10-27 DIAGNOSIS — H269 Unspecified cataract: Secondary | ICD-10-CM | POA: Diagnosis not present

## 2020-10-28 DIAGNOSIS — H269 Unspecified cataract: Secondary | ICD-10-CM | POA: Diagnosis not present

## 2020-10-29 DIAGNOSIS — H269 Unspecified cataract: Secondary | ICD-10-CM | POA: Diagnosis not present

## 2020-10-29 NOTE — Telephone Encounter (Signed)
I attempted to call Caryl Pina back again today. She did not answer the phone, but I did leave a voicemail asking her to call me back.

## 2020-10-30 DIAGNOSIS — H269 Unspecified cataract: Secondary | ICD-10-CM | POA: Diagnosis not present

## 2020-10-31 DIAGNOSIS — H269 Unspecified cataract: Secondary | ICD-10-CM | POA: Diagnosis not present

## 2020-11-02 NOTE — Telephone Encounter (Signed)
I was able to connect with Drew Cruz and she informed me that per chart review the patient no longer qualifies for patient care services in the home. She tells me she has discussed this with the patient and no additional documentation is needed at this time.

## 2020-11-05 ENCOUNTER — Ambulatory Visit (HOSPITAL_COMMUNITY): Payer: Medicaid Other

## 2020-11-23 ENCOUNTER — Encounter: Payer: Self-pay | Admitting: Internal Medicine

## 2020-12-02 ENCOUNTER — Ambulatory Visit (HOSPITAL_COMMUNITY): Payer: Medicaid Other

## 2020-12-15 ENCOUNTER — Telehealth (INDEPENDENT_AMBULATORY_CARE_PROVIDER_SITE_OTHER): Payer: Self-pay

## 2020-12-15 DIAGNOSIS — Z87891 Personal history of nicotine dependence: Secondary | ICD-10-CM

## 2020-12-15 NOTE — Telephone Encounter (Signed)
Melissa from [facility] called and left a detailed voice message stating that patient has an appointment on 12/18/2020 at [facility] for a CT scan of chest and not approved yet. This has been rescheduled several times, referral from 07/15/2020. [place] [employer] and her call back number is [phone] extension [phone].  Called and received a voice message and left a detailed message with information to call us back to see how I can help.

## 2020-12-15 NOTE — Telephone Encounter (Signed)
Since Nellie is not here this week, I would recommend that we we will have to cancel the appointment on 12/18/2020.  It is not an urgent scan but clearly needs to be done.  The other option is to seek help from [facility] to see if anyone can help with the approval process for the CT scan.

## 2020-12-16 NOTE — Telephone Encounter (Signed)
The test needs to be a low dose chest CT

## 2020-12-18 ENCOUNTER — Ambulatory Visit (HOSPITAL_COMMUNITY): Payer: Medicaid Other

## 2020-12-22 NOTE — Telephone Encounter (Signed)
Do you need to put in a different order? For low dose CT of chest? Or do I need to complete?

## 2020-12-23 NOTE — Telephone Encounter (Signed)
Pt has appt in April set already.

## 2020-12-23 NOTE — Telephone Encounter (Signed)
The one that was ordered for lung cancer screening is the appropriate test so please complete.

## 2020-12-24 ENCOUNTER — Encounter (INDEPENDENT_AMBULATORY_CARE_PROVIDER_SITE_OTHER): Payer: Self-pay

## 2020-12-24 NOTE — Telephone Encounter (Signed)
WORKING ON AUTH  INS IS STATING HE IS NOT ACTIVE IN [facility]. SO, I AM GOING TO CALL HIS CARD PROVIDER FOR HELP WITH  THIS TOO.  TBA-STATUS AND LINK TO REFERRAL.

## 2020-12-24 NOTE — Telephone Encounter (Signed)
Okay, thanks

## 2020-12-24 NOTE — Telephone Encounter (Signed)
Sending a letter to home address to get a copy of card today.

## 2020-12-24 NOTE — Telephone Encounter (Signed)
Perfect

## 2020-12-29 ENCOUNTER — Encounter (INDEPENDENT_AMBULATORY_CARE_PROVIDER_SITE_OTHER): Payer: Self-pay | Admitting: Nurse Practitioner

## 2020-12-29 ENCOUNTER — Telehealth (INDEPENDENT_AMBULATORY_CARE_PROVIDER_SITE_OTHER): Payer: Self-pay

## 2020-12-29 NOTE — Telephone Encounter (Signed)
I have written a letter. Please print it out and ask Santiago Glad to mail it. Thank you.

## 2020-12-30 NOTE — Telephone Encounter (Signed)
Thank you :)

## 2021-01-07 ENCOUNTER — Other Ambulatory Visit: Payer: Self-pay

## 2021-01-07 ENCOUNTER — Other Ambulatory Visit (INDEPENDENT_AMBULATORY_CARE_PROVIDER_SITE_OTHER): Payer: Self-pay | Admitting: Nurse Practitioner

## 2021-01-07 ENCOUNTER — Encounter (INDEPENDENT_AMBULATORY_CARE_PROVIDER_SITE_OTHER): Payer: Medicaid Other | Admitting: Nurse Practitioner

## 2021-01-07 DIAGNOSIS — R413 Other amnesia: Secondary | ICD-10-CM

## 2021-01-07 NOTE — Progress Notes (Signed)
Patient came to the office with his previous home health aide, and stated he did not have any concerns with shortness of breath. They are requesting to be reevaluated to see if he would qualify for his home services to be reinstated. They did not want to undergo an official office visit today.

## 2021-01-07 NOTE — Progress Notes (Signed)
This patient arrives today with his previous personal care aide to see if he can requalify for the services.  I believe at some point he services were no longer reimbursed because there was question regarding whether or not he needed the services.  They both endorse the patient has difficulty with cooking and cleaning.  She also endorses that he will often forget to bathe so she assists him with this.   She will also assist him with administering his medications.  I will reorder home health services to come out and evaluate the patient to see if he would qualify for these needs being met by home health agency employee.

## 2021-01-12 ENCOUNTER — Ambulatory Visit (HOSPITAL_COMMUNITY): Payer: Medicaid Other

## 2021-01-12 ENCOUNTER — Other Ambulatory Visit (HOSPITAL_COMMUNITY): Payer: Medicaid Other

## 2021-01-19 ENCOUNTER — Telehealth (INDEPENDENT_AMBULATORY_CARE_PROVIDER_SITE_OTHER): Payer: Self-pay

## 2021-01-19 NOTE — Telephone Encounter (Signed)
Received a voice message from [facility] [phone]. Solmon Ice stated that the patient has new insurance and she is going to call back to give [name] the new information on his card. Sending as Juluis Rainier

## 2021-01-19 NOTE — Telephone Encounter (Signed)
Thank you! Then I can get his referral / order approved to get testing done ASAP.

## 2021-01-20 NOTE — Telephone Encounter (Signed)
[name] called again and left a voice message for [name] to call her back at [phone] to give [name] patient Drew Cruz new insurance information.  Are you able to call her back to get the new information?

## 2021-01-20 NOTE — Telephone Encounter (Signed)
I contacted Solmon Ice and she has contacted her case worker and they forwarded her call to the [facility] ID# [id number] Group# [id number]. Please advise with Felicia to get MRI set up for Mr Favila.

## 2021-01-25 NOTE — Telephone Encounter (Signed)
Will attempt to resubmit again today. Will let you know if it is approved.

## 2021-01-27 ENCOUNTER — Other Ambulatory Visit (HOSPITAL_COMMUNITY): Payer: Self-pay

## 2021-01-27 DIAGNOSIS — Z122 Encounter for screening for malignant neoplasm of respiratory organs: Secondary | ICD-10-CM

## 2021-01-27 DIAGNOSIS — Z87891 Personal history of nicotine dependence: Secondary | ICD-10-CM

## 2021-01-27 NOTE — Progress Notes (Signed)
Referral received for lung cancer screening program. Patient's SDMV was in 2018 with Dr. Meda Coffee. Patient scheduled for follow-up on 05/25 at 0800. Patient aware.

## 2021-02-09 ENCOUNTER — Telehealth (INDEPENDENT_AMBULATORY_CARE_PROVIDER_SITE_OTHER): Payer: Self-pay

## 2021-02-09 NOTE — Telephone Encounter (Signed)
Felicia called and left a voice message stating that she thought this patient has an appointment on 02/13/2021.  I called Felicia back and let her know that this patient has an appointment on 02/24/2021 at 9:30am and to try to be here by 9:15am as he has a CT scan at [facility] the same morning at [place] verbalized an understanding.

## 2021-02-22 ENCOUNTER — Ambulatory Visit (INDEPENDENT_AMBULATORY_CARE_PROVIDER_SITE_OTHER): Payer: Medicaid Other | Admitting: Nurse Practitioner

## 2021-02-24 ENCOUNTER — Ambulatory Visit (HOSPITAL_COMMUNITY)
Admission: RE | Admit: 2021-02-24 | Discharge: 2021-02-24 | Disposition: A | Discharge status: Home / Self Care | Payer: Medicaid Other | Source: Ambulatory Visit | Attending: Oncology | Admitting: Oncology

## 2021-02-24 ENCOUNTER — Ambulatory Visit (INDEPENDENT_AMBULATORY_CARE_PROVIDER_SITE_OTHER): Payer: Medicaid Other | Admitting: Nurse Practitioner

## 2021-02-24 DIAGNOSIS — Z87891 Personal history of nicotine dependence: Secondary | ICD-10-CM | POA: Insufficient documentation

## 2021-02-24 DIAGNOSIS — Z122 Encounter for screening for malignant neoplasm of respiratory organs: Secondary | ICD-10-CM | POA: Diagnosis not present

## 2021-02-25 ENCOUNTER — Encounter (HOSPITAL_COMMUNITY): Payer: Self-pay

## 2021-02-25 NOTE — Progress Notes (Signed)
Patient notified of LDCT Lung Cancer Screening Results via mail with the recommendation to follow-up in 12 months. Patient's referring provider has been sent a copy of results. Results are as follows:  IMPRESSION: 1. Lung-RADS 2S, benign appearance or behavior. Continue annual screening with low-dose chest CT without contrast in 12 months. 2. The "S" modifier above refers to potentially clinically significant non lung cancer related findings. Specifically, there is aortic atherosclerosis, in addition to left main and 2 vessel coronary artery disease. Please note that although the presence of coronary artery calcium documents the presence of coronary artery disease, the severity of this disease and any potential stenosis cannot be assessed on this non-gated CT examination. Assessment for potential risk factor modification, dietary therapy or pharmacologic therapy may be warranted, if clinically indicated. 3. Mild diffuse bronchial wall thickening with mild centrilobular and paraseptal emphysema; imaging findings suggestive of underlying COPD.  Aortic Atherosclerosis (ICD10-I70.0) and Emphysema (ICD10-J43.9).

## 2021-03-18 ENCOUNTER — Ambulatory Visit (INDEPENDENT_AMBULATORY_CARE_PROVIDER_SITE_OTHER): Payer: Medicaid Other | Admitting: Nurse Practitioner

## 2021-03-18 ENCOUNTER — Encounter (INDEPENDENT_AMBULATORY_CARE_PROVIDER_SITE_OTHER): Payer: Self-pay | Admitting: Nurse Practitioner

## 2021-03-18 ENCOUNTER — Other Ambulatory Visit: Payer: Self-pay

## 2021-03-18 VITALS — BP 132/82 | HR 87 | Temp 96.0°F | Ht 71.0 in | Wt 179.0 lb

## 2021-03-18 DIAGNOSIS — I10 Essential (primary) hypertension: Secondary | ICD-10-CM | POA: Diagnosis not present

## 2021-03-18 DIAGNOSIS — E559 Vitamin D deficiency, unspecified: Secondary | ICD-10-CM

## 2021-03-18 DIAGNOSIS — E785 Hyperlipidemia, unspecified: Secondary | ICD-10-CM

## 2021-03-18 DIAGNOSIS — I700 Atherosclerosis of aorta: Secondary | ICD-10-CM | POA: Diagnosis not present

## 2021-03-18 MED ORDER — ATORVASTATIN CALCIUM 80 MG PO TABS: 80.0000 mg | ORAL_TABLET | Freq: Every day | ORAL | 1 refills | Status: AC

## 2021-03-18 MED ORDER — LISINOPRIL 20 MG PO TABS: 20.0000 mg | ORAL_TABLET | Freq: Every day | ORAL | 1 refills | Status: AC

## 2021-03-18 NOTE — Progress Notes (Signed)
   Subjective:  Patient ID: Drew Cruz, male    DOB: 07/12/1956  Age: 64 y.o. MRN: [id number]  CC:  Chief Complaint  Patient presents with   Follow-up    Doing okay, no concerns, discuss xray and ct scan   Hyperlipidemia   Hypertension   Other    Vitamin D deficiency      HPI  This patient arrives today for the above.  Hyperlipidemia: He continues on atorvastatin aspirin.  Most recent CT scan of the chest that was completed for lung cancer screening did show aortic atherosclerosis.  Last LDL was collected a little bit over a year ago and it was 36.  Hypertension: He continues on lisinopril and is tolerating this medication well.  Vitamin D deficiency: He continues on 1000 IUs of vitamin D3 daily.  He is due to have serum check today.  Past Medical History:  Diagnosis Date   Cataracts, bilateral    Hypercholesteremia    Hypertension    Pulmonary emphysema determined by X-ray (HCC) 07/04/2017   Stroke (HCC)    04/2015. left sided facial weakness.   Substance abuse (HCC)       Family History  Problem Relation Age of Onset   Cancer Mother    Early death Father        murder   Other Sister 2       meningitis with resultant brain damage   Colon cancer Neg Hx     Social History   Social History Narrative   Has an aid - makes sure takes meds, cooks   Disabled - uncertain why   "cant take care of myself"   Lives with mother and disabled sister   Leisure- watch TV, goes to friends house   Social History   Tobacco Use   Smoking status: Every Day    Packs/day: 0.50    Years: 53.00    Pack years: 26.50    Types: Cigarettes    Start date: 10/03/1965   Smokeless tobacco: Never   Tobacco comments:    Has been unsuccessful wih quitting attempts previously  Substance Use Topics   Alcohol use: Yes    Alcohol/week: 3.0 standard drinks    Types: 3 Cans of beer per week    Comment: sometimes drinks 40 oz or 2-3 bottles/cans of beers a day since age 15 or 16.      Current Meds  Medication Sig   aspirin 325 MG tablet Take 1 tablet (325 mg total) by mouth daily.   Cholecalciferol 25 MCG (1000 UT) CHEW Chew 1 tablet by mouth.   [DISCONTINUED] atorvastatin (LIPITOR) 80 MG tablet Take 1 tablet (80 mg total) by mouth daily.   [DISCONTINUED] lisinopril (ZESTRIL) 20 MG tablet Take 1 tablet (20 mg total) by mouth daily.    ROS:  Review of Systems  Eyes:  Negative for blurred vision.  Respiratory:  Negative for shortness of breath.   Cardiovascular:  Negative for chest pain.  Neurological:  Negative for dizziness and headaches.    Objective:   Today's Vitals: BP 132/82   Pulse 87   Temp (!) 96 F (35.6 C) (Temporal)   Ht 5' 11" (1.803 m)   Wt 179 lb (81.2 kg)   SpO2 96%   BMI 24.97 kg/m  Vitals with BMI 03/18/2021 10/26/2020 07/15/2020  Height 5' 11" 5' 11" 5' 11"  Weight 179 lbs 185 lbs 182 lbs 5 oz  BMI 24.98 25.81 25.44  Systolic 132 136 124    Diastolic 82 82 80  Pulse 87 90 80     Physical Exam Vitals reviewed.  Constitutional:      Appearance: Normal appearance.  HENT:     Head: Normocephalic and atraumatic.  Cardiovascular:     Rate and Rhythm: Normal rate and regular rhythm.  Pulmonary:     Effort: Pulmonary effort is normal.     Breath sounds: Normal breath sounds.  Musculoskeletal:     Cervical back: Neck supple.  Skin:    General: Skin is warm and dry.  Neurological:     Mental Status: He is alert and oriented to person, place, and time.  Psychiatric:        Mood and Affect: Mood normal.        Behavior: Behavior normal.        Thought Content: Thought content normal.        Judgment: Judgment normal.         Assessment and Plan   1. Vitamin D deficiency   2. Essential hypertension   3. Hyperlipidemia, unspecified hyperlipidemia type   4. Aortic atherosclerosis (HCC)      Plan: 1.  We will check serum vitamin D level and CMP today for further evaluation. 2.  We will check CMP and continue 1 his  lisinopril as prescribed for now.  Refill sent to pharmacy. 3.,  4.  He will continue on his atorvastatin aspirin today.  We will check lipid panel.  Atorvastatin refill sent to pharmacy.  Of note, we did discuss his CT scan results of the chest and that did not show any evidence of lung cancer at this time.  We also discussed the presence of aortic atherosclerosis and what this means.  We also discussed that there are signs of possible COPD on CT scan.   Tests ordered Orders Placed This Encounter  Procedures   Vitamin D, 25-hydroxy   CMP with eGFR([facility])   Lipid Panel      Meds ordered this encounter  Medications   lisinopril (ZESTRIL) 20 MG tablet    Sig: Take 1 tablet (20 mg total) by mouth daily.    Dispense:  90 tablet    Refill:  1    Order Specific Question:   Supervising Provider    Answer:   GOSRANI, NIMISH C [[id number]]   atorvastatin (LIPITOR) 80 MG tablet    Sig: Take 1 tablet (80 mg total) by mouth daily.    Dispense:  90 tablet    Refill:  1    Order Specific Question:   Supervising Provider    Answer:   GOSRANI, NIMISH C [[id number]]    Patient to follow-up in 3 months or sooner as needed.  SARAH E GRAY, NP  

## 2021-03-19 LAB — LIPID PANEL
Cholesterol: 238 mg/dL — ABNORMAL HIGH (ref <200)
HDL: 92 mg/dL (ref >=40)
LDL Cholesterol (Calc): 114 mg/dL (calc) — ABNORMAL HIGH
Non-HDL Cholesterol (Calc): 146 mg/dL (calc) — ABNORMAL HIGH (ref <130)
Total CHOL/HDL Ratio: 2.6 (calc) (ref <5.0)
Triglycerides: 207 mg/dL — ABNORMAL HIGH (ref <150)

## 2021-03-19 LAB — COMPLETE METABOLIC PANEL WITH GFR
AG Ratio: 1.6 (calc) (ref 1.0–2.5)
ALT: 17 U/L (ref 9–46)
AST: 18 U/L (ref 10–35)
Albumin: 4.5 g/dL (ref 3.6–5.1)
Alkaline phosphatase (APISO): 61 U/L (ref 35–144)
BUN: 11 mg/dL (ref 7–25)
CO2: 25 mmol/L (ref 20–32)
Calcium: 9.7 mg/dL (ref 8.6–10.3)
Chloride: 105 mmol/L (ref 98–110)
Creat: 0.8 mg/dL (ref 0.70–1.25)
GFR, Est African American: 109 mL/min/{1.73_m2} (ref >=60)
GFR, Est Non African American: 94 mL/min/{1.73_m2} (ref >=60)
Globulin: 2.9 g/dL (calc) (ref 1.9–3.7)
Glucose, Bld: 95 mg/dL (ref 65–99)
Potassium: 4.2 mmol/L (ref 3.5–5.3)
Sodium: 139 mmol/L (ref 135–146)
Total Bilirubin: 0.4 mg/dL (ref 0.2–1.2)
Total Protein: 7.4 g/dL (ref 6.1–8.1)

## 2021-03-19 LAB — VITAMIN D 25 HYDROXY (VIT D DEFICIENCY, FRACTURES): Vit D, 25-Hydroxy: 23 ng/mL — ABNORMAL LOW (ref 30–100)

## 2021-03-20 IMAGING — DX CHEST - 2 VIEW
2 series · 2 of 2 positions shown · non-contrast
Comparison: Chest radiograph May 10, 2016 and chest CT July 03, 2017.

CLINICAL DATA: Cough and shortness of breath

EXAM:
CHEST - 2 VIEW

[chest pa]
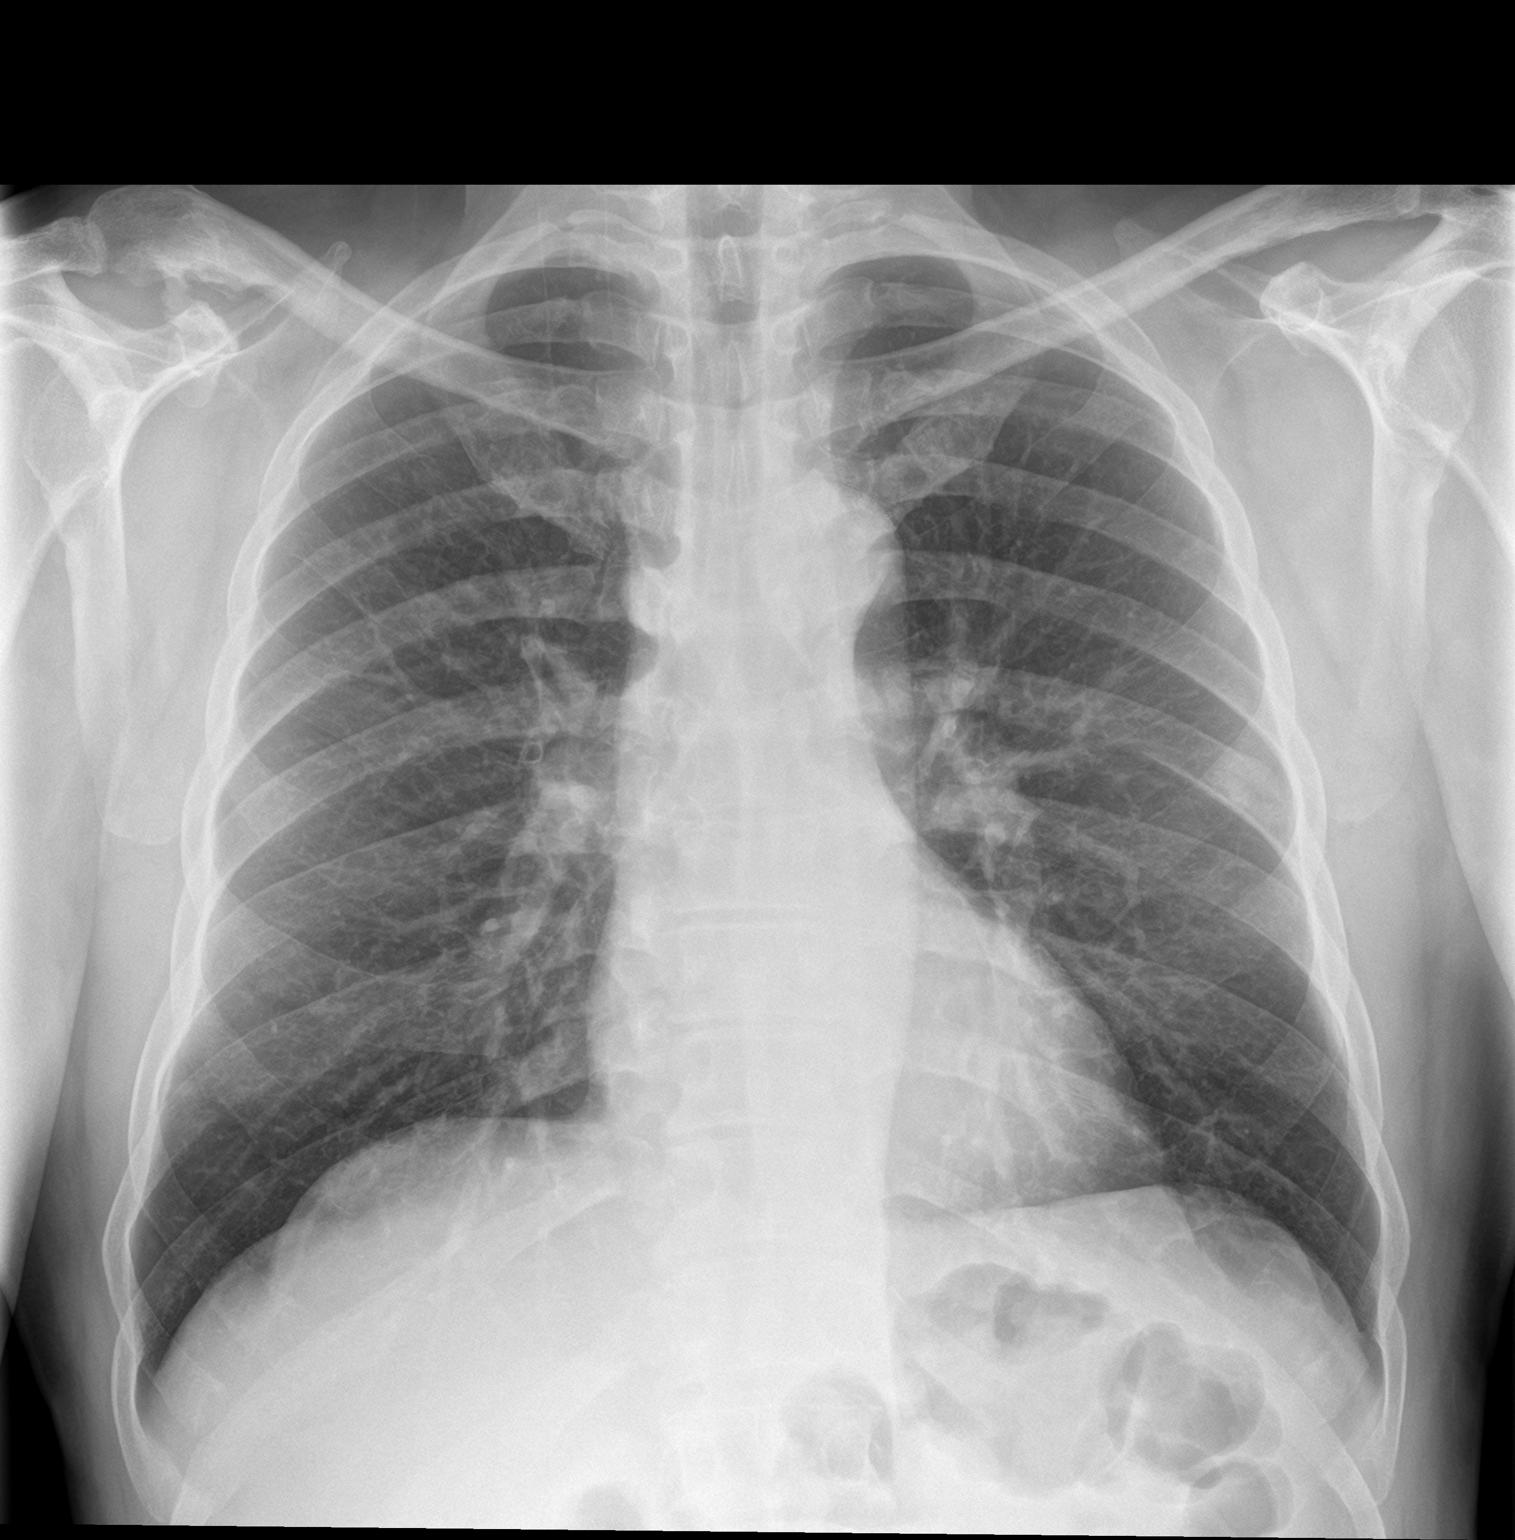

[chest lat]
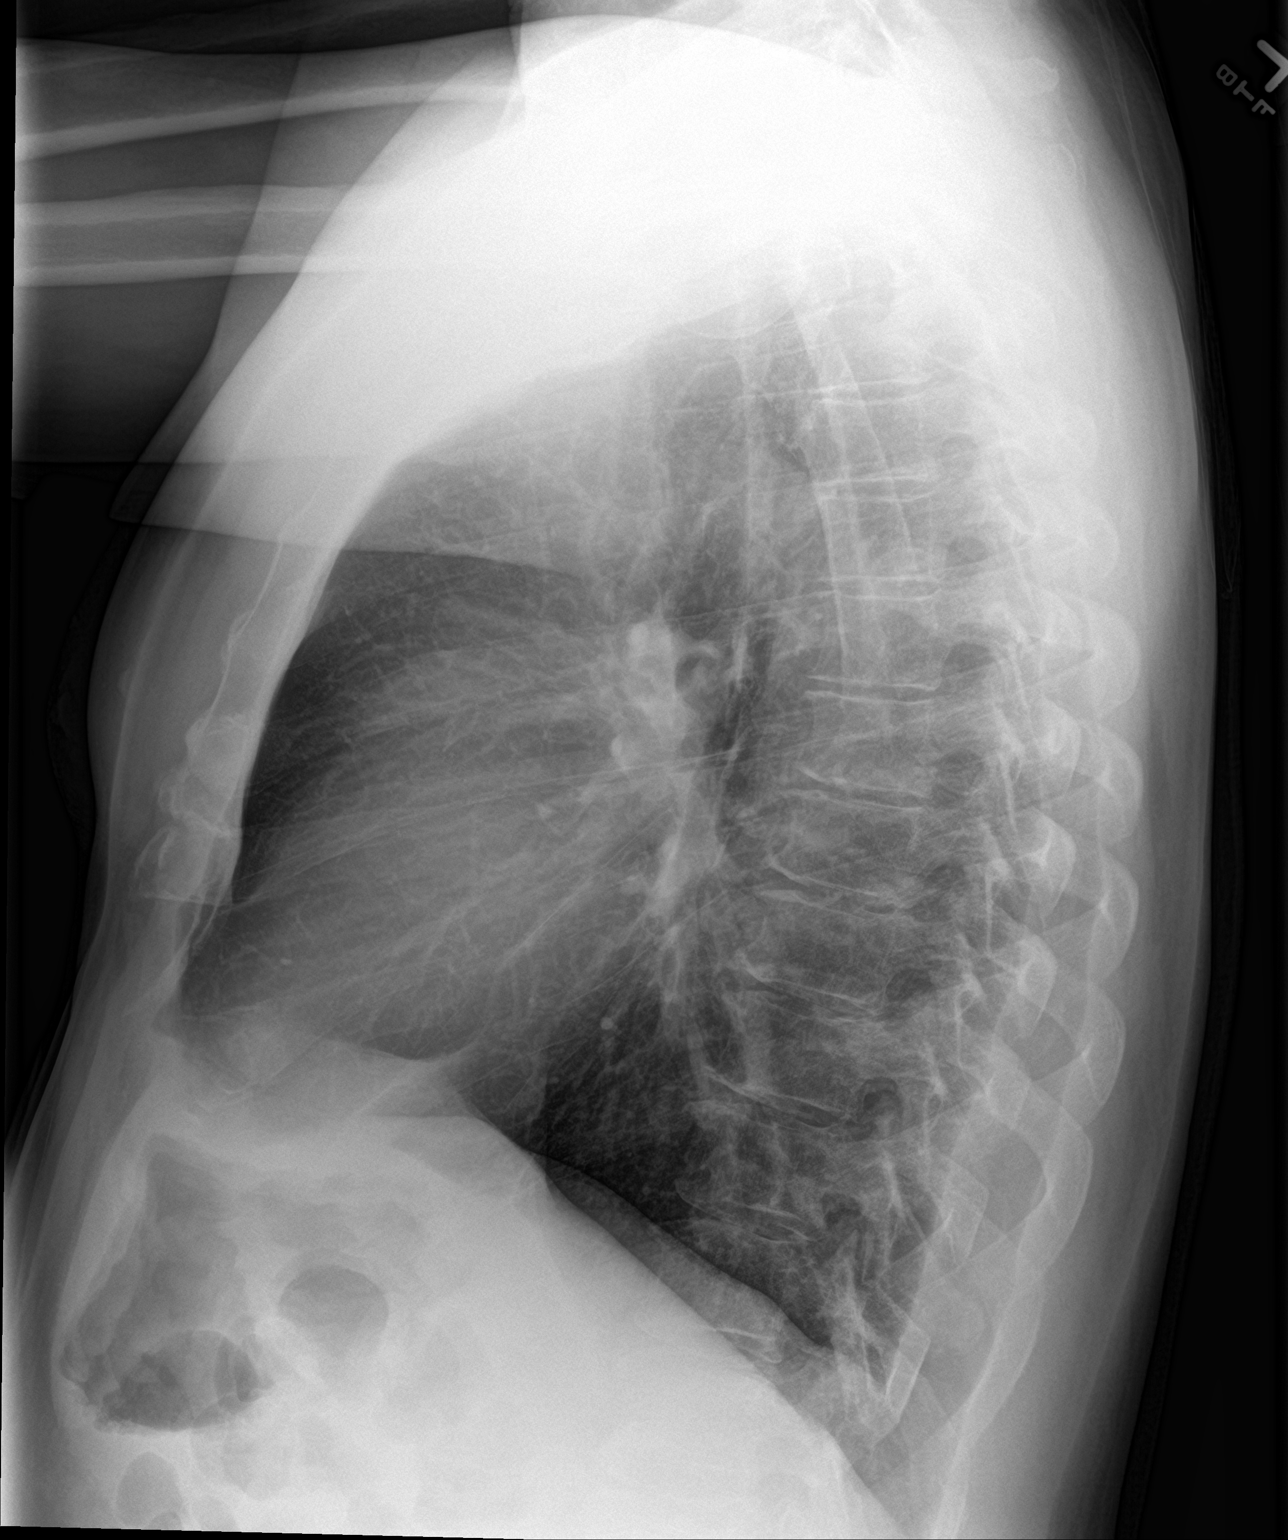

[2 of 2 positions shown; findings below may reference images not displayed]

FINDINGS: There is no appreciable edema or consolidation. Heart size and
pulmonary vascularity are normal. No adenopathy. Sclerosis is noted
along the lateral left seventh rib at the site of healed trauma,
stable.
IMPRESSION: No edema or consolidation.  Heart size within normal limits.

## 2021-06-16 ENCOUNTER — Ambulatory Visit (INDEPENDENT_AMBULATORY_CARE_PROVIDER_SITE_OTHER): Payer: Medicaid Other | Admitting: Nurse Practitioner

## 2021-08-12 ENCOUNTER — Other Ambulatory Visit (HOSPITAL_COMMUNITY)
Admission: RE | Admit: 2021-08-12 | Discharge: 2021-08-12 | Disposition: A | Discharge status: Home / Self Care | Payer: Medicare Other | Source: Ambulatory Visit | Attending: Internal Medicine | Admitting: Internal Medicine

## 2021-08-12 ENCOUNTER — Ambulatory Visit (HOSPITAL_COMMUNITY)
Admission: RE | Admit: 2021-08-12 | Discharge: 2021-08-12 | Disposition: A | Discharge status: Home / Self Care | Payer: Medicare Other | Source: Ambulatory Visit | Attending: Gerontology | Admitting: Gerontology

## 2021-08-12 ENCOUNTER — Other Ambulatory Visit (HOSPITAL_COMMUNITY): Payer: Self-pay | Admitting: Gerontology

## 2021-08-12 DIAGNOSIS — R059 Cough, unspecified: Secondary | ICD-10-CM | POA: Insufficient documentation

## 2021-08-12 DIAGNOSIS — R739 Hyperglycemia, unspecified: Secondary | ICD-10-CM | POA: Insufficient documentation

## 2021-08-12 DIAGNOSIS — Z0001 Encounter for general adult medical examination with abnormal findings: Secondary | ICD-10-CM | POA: Insufficient documentation

## 2021-08-12 DIAGNOSIS — I10 Essential (primary) hypertension: Secondary | ICD-10-CM | POA: Insufficient documentation

## 2021-08-12 DIAGNOSIS — Z125 Encounter for screening for malignant neoplasm of prostate: Secondary | ICD-10-CM | POA: Insufficient documentation

## 2021-08-12 LAB — CBC WITH DIFFERENTIAL/PLATELET
Abs Immature Granulocytes: 0.03 10*3/uL (ref 0.00–0.07)
Basophils Absolute: 0 10*3/uL (ref 0.0–0.1)
Basophils Relative: 0 %
Eosinophils Absolute: 0 10*3/uL (ref 0.0–0.5)
Eosinophils Relative: 0 %
HCT: 40.4 % (ref 39.0–52.0)
Hemoglobin: 14.3 g/dL (ref 13.0–17.0)
Immature Granulocytes: 0 %
Lymphocytes Relative: 22 %
Lymphs Abs: 1.9 10*3/uL (ref 0.7–4.0)
MCH: 31.3 pg (ref 26.0–34.0)
MCHC: 35.4 g/dL (ref 30.0–36.0)
MCV: 88.4 fL (ref 80.0–100.0)
Monocytes Absolute: 0.5 10*3/uL (ref 0.1–1.0)
Monocytes Relative: 6 %
Neutro Abs: 6 10*3/uL (ref 1.7–7.7)
Neutrophils Relative %: 72 %
Platelets: 347 10*3/uL (ref 150–400)
RBC: 4.57 MIL/uL (ref 4.22–5.81)
RDW: 13.2 % (ref 11.5–15.5)
WBC: 8.5 10*3/uL (ref 4.0–10.5)
nRBC: 0 % (ref 0.0–0.2)

## 2021-08-12 LAB — LIPID PANEL
Cholesterol: 184 mg/dL (ref 0–200)
HDL: 94 mg/dL (ref >40)
LDL Cholesterol: 69 mg/dL (ref 0–99)
Total CHOL/HDL Ratio: 2 RATIO
Triglycerides: 105 mg/dL (ref <150)
VLDL: 21 mg/dL (ref 0–40)

## 2021-08-12 LAB — BASIC METABOLIC PANEL
Anion gap: 12 (ref 5–15)
BUN: 16 mg/dL (ref 8–23)
CO2: 23 mmol/L (ref 22–32)
Calcium: 9.1 mg/dL (ref 8.9–10.3)
Chloride: 104 mmol/L (ref 98–111)
Creatinine, Ser: 0.83 mg/dL (ref 0.61–1.24)
GFR, Estimated: >60 mL/min (ref >60)
Glucose, Bld: 98 mg/dL (ref 70–99)
Potassium: 3.7 mmol/L (ref 3.5–5.1)
Sodium: 139 mmol/L (ref 135–145)

## 2021-08-12 LAB — HEPATIC FUNCTION PANEL
ALT: 39 U/L (ref 0–44)
AST: 34 U/L (ref 15–41)
Albumin: 4.4 g/dL (ref 3.5–5.0)
Alkaline Phosphatase: 59 U/L (ref 38–126)
Bilirubin, Direct: 0.1 mg/dL (ref 0.0–0.2)
Indirect Bilirubin: 0.5 mg/dL (ref 0.3–0.9)
Total Bilirubin: 0.6 mg/dL (ref 0.3–1.2)
Total Protein: 7.9 g/dL (ref 6.5–8.1)

## 2021-08-12 LAB — PSA: Prostatic Specific Antigen: 5.13 ng/mL — ABNORMAL HIGH (ref 0.00–4.00)

## 2021-08-13 ENCOUNTER — Encounter: Payer: Self-pay | Admitting: Internal Medicine

## 2021-08-13 LAB — HEMOGLOBIN A1C
Hgb A1c MFr Bld: 5.3 % (ref 4.8–5.6)
Mean Plasma Glucose: 105 mg/dL

## 2021-12-23 NOTE — Progress Notes (Signed)
? ? ?Primary Care Physician:  Ailene Ards, NP ?Primary Gastroenterologist:  Dr. Abbey Chatters ? ?Chief Complaint  ?Patient presents with  ? Colonoscopy  ? ? ?HPI:   ?Drew Cruz is a 66 y.o. male presenting today to discuss scheduling surveillance colonoscopy. ? ?His last colonoscopy was in February 2017 with 1 hyperplastic rectal polyp removed, small internal hemorrhoids, poor prep in right colon.  Recommended repeat colonoscopy in 5 years due to poor prep. ? ?Today:  ?Doing well overall.  No GI concerns.  Denies abdominal pain, constipation, diarrhea, BRBPR, melena, unintentional weight loss, nausea, vomiting, reflux symptoms, or dysphagia. ? ?Reports taking his entire colon prep last time and following clear liquid diet as he was supposed to.  He was having significant watery stools with the colon prep.  Denies any history of constipation previously. ? ? ?Past Medical History:  ?Diagnosis Date  ? Cataracts, bilateral   ? Hypercholesteremia   ? Hypertension   ? Pulmonary emphysema determined by X-ray ([place]) 07/04/2017  ? Stroke [facility])   ? 04/2015. left sided facial weakness.  ? Substance abuse ([place])   ? ? ?Past Surgical History:  ?Procedure Laterality Date  ? CATARACT EXTRACTION W/PHACO Right 05/25/2015  ? Procedure: CATARACT EXTRACTION PHACO AND INTRAOCULAR LENS PLACEMENT RIGHT EYE CDE=16.98;  Surgeon: Tonny Branch, MD;  Location: [facility] ORS;  Service: Ophthalmology;  Laterality: Right;  ? CATARACT EXTRACTION W/PHACO Left 06/25/2015  ? Procedure: CATARACT EXTRACTION PHACO AND INTRAOCULAR LENS PLACEMENT ([facility]);  Surgeon: Tonny Branch, MD;  Location: [facility] ORS;  Service: Ophthalmology;  Laterality: Left;  CDE: 17.56  ? COLONOSCOPY WITH PROPOFOL N/A 11/24/2015  ? Surgeon: Danie Binder, MD;  1 hyperplastic rectal polyp removed, small internal hemorrhoids, poor prep in right colon.  Recommended repeat colonoscopy in 5 years due to poor prep.  ? POLYPECTOMY  11/24/2015  ? Procedure: POLYPECTOMY;  Surgeon: Danie Binder, MD;  Location:  [facility] END[facility] SUITE;  Service: Endoscopy;;  rectal polyp  ? ? ?Current Outpatient Medications  ?Medication Sig Dispense Refill  ? aspirin 325 MG tablet Take 1 tablet (325 mg total) by mouth daily. 100 tablet 3  ? atorvastatin (LIPITOR) 80 MG tablet Take 1 tablet (80 mg total) by mouth daily. 90 tablet 1  ? Cholecalciferol 25 MCG (1000 UT) CHEW Chew 1 tablet by mouth.    ? lisinopril (ZESTRIL) 20 MG tablet Take 1 tablet (20 mg total) by mouth daily. 90 tablet 1  ? ?No current facility-administered medications for this visit.  ? ? ?Allergies as of 12/24/2021  ? (No Known Allergies)  ? ? ?Family History  ?Problem Relation Age of Onset  ? Cancer Mother   ? Early death Father   ?     murder  ? Other Sister 2  ?     meningitis with resultant brain damage  ? Colon cancer Neg Hx   ? ? ?Social History  ? ?Socioeconomic History  ? Marital status: Single  ?  Spouse name: Not on file  ? Number of children: 0  ? Years of education: 45  ? Highest education level: Not on file  ?Occupational History  ? Occupation: disability  ?  Comment: "cataracts and stroke"  ?Tobacco Use  ? Smoking status: Every Day  ?  Packs/day: 0.50  ?  Years: 53.00  ?  Pack years: 26.50  ?  Types: Cigarettes  ?  Start date: 10/03/1965  ? Smokeless tobacco: Never  ? Tobacco comments:  ?  Has been unsuccessful wih  quitting attempts previously  ?Vaping Use  ? Vaping Use: Never used  ?Substance and Sexual Activity  ? Alcohol use: Yes  ?  Alcohol/week: 3.0 standard drinks  ?  Types: 3 Cans of beer per week  ?  Comment: 2-3 12 oz beer and a 40 at times daily.  ? Drug use: No  ? Sexual activity: Never  ?  Birth control/protection: None  ?Other Topics Concern  ? Not on file  ?Social History Narrative  ? Has an aid - makes sure takes meds, cooks  ? Disabled - uncertain why  ? "cant take care of myself"  ? Lives with mother and disabled sister  ? Leisure- watch TV, goes to friends house  ? ?Social Determinants of Health  ? ?Financial Resource Strain: Not on file  ?Food  Insecurity: Not on file  ?Transportation Needs: Not on file  ?Physical Activity: Not on file  ?Stress: Not on file  ?Social Connections: Not on file  ?Intimate Partner Violence: Not on file  ? ? ?Review of Systems: ?Gen: Denies any fever, chills, cold or flu like symptoms, presyncope, or syncope. ?CV: Denies chest pain, heart palpitations. ?Resp: Denies shortness of breath or cough. ?GI: See HPI ?GU : Denies urinary burning, urinary frequency, urinary hesitancy ?MS: Denies joint pain ?Derm: Denies rash ?Psych: Denies depression, anxiety ?Heme: See HPI ? ?Physical Exam: ?BP 128/78   Pulse 78   Temp 97.7 ?F (36.5 ?C) (Temporal)   Ht '5\' 11"'$  (1.803 m)   Wt 176 lb 9.6 oz (80.1 kg)   BMI 24.63 kg/m?  ?General:   Alert and oriented. Pleasant and cooperative. Well-nourished and well-developed.  ?Head:  Normocephalic and atraumatic. ?Eyes:  Without icterus, sclera clear and conjunctiva pink.  ?Ears:  Normal auditory acuity. ?Lungs:  Clear to auscultation bilaterally. No wheezes, rales, or rhonchi. No distress.  ?Heart:  S1, S2 present without murmurs appreciated.  ?Abdomen:  +BS, soft, non-tender and non-distended. No HSM noted. No guarding or rebound. No masses appreciated.  ?Rectal:  Deferred  ?Msk:  Symmetrical without gross deformities. Normal posture. ?Extremities:  Without edema. ?Neurologic:  Alert and  oriented x4;  grossly normal neurologically. ?Skin:  Intact without significant lesions or rashes. ?Psych:  Normal mood and affect. ? ? ? ?Assessment:  ?66 year old male with history of alcohol abuse, HTN, HLD, emphysema, stroke, presenting today to discuss scheduling surveillance colonoscopy.  Last colonoscopy was in February 2017 with 1 hyperplastic rectal polyp removed, small internal hemorrhoids, poor prep in right colon.  Recommended repeat colonoscopy in 5 years due to poor prep. Clinically he is doing well without any significant GI symptoms. No alarm symptoms. No Fhx of colon cancer.  ? ? ?Plan:  ?Proceed  with colonoscopy with propofol with Dr. Abbey Chatters in the near future. The risks, benefits, and alternatives have been discussed with the patient in detail. The patient states understanding and desires to proceed. ?ASA 3 ?Extra half day of clear liquids and extra one half bowel prep. ?Linzess 145 mcg daily x4 days prior to starting bowel prep. Samples provided.  ?Follow-up PRN ? ? ?Aliene Altes, PA-C ?[facility] ?12/24/2021 ?  ?

## 2021-12-24 ENCOUNTER — Ambulatory Visit (INDEPENDENT_AMBULATORY_CARE_PROVIDER_SITE_OTHER): Payer: Medicare Other | Admitting: Gastroenterology

## 2021-12-24 ENCOUNTER — Other Ambulatory Visit: Payer: Self-pay

## 2021-12-24 ENCOUNTER — Encounter: Payer: Self-pay | Admitting: Gastroenterology

## 2021-12-24 VITALS — BP 128/78 | HR 78 | Temp 97.7°F | Ht 71.0 in | Wt 176.6 lb

## 2021-12-24 DIAGNOSIS — Z86010 Personal history of colonic polyps: Secondary | ICD-10-CM

## 2021-12-24 MED ORDER — PEG 3350-KCL-NA BICARB-NACL 420 G PO SOLR: 4000.0000 mL | ORAL | 0 refills | Status: DC

## 2021-12-24 NOTE — Patient Instructions (Addendum)
We will arrange for you to have a colonoscopy in the near future with Dr. Abbey Chatters. ?Due to poor prep previously, we will be giving you extra bowel prep and an extra 1/2-day of clear liquids. ?We are also giving you Linzess 145 mcg to take daily x4 days prior to starting your colon prep. Take this medication at least 30 minutes before you eat your first meal of the day.  ?As we discussed Linzess can cause diarrhea.  ? ?It was good to meet you today!  ? ?We will see you back as needed. Do not hesitate to call with any GI concerns.  ? ?Aliene Altes, PA-C ?[facility] ? ?

## 2022-01-10 NOTE — Patient Instructions (Signed)
? ? ? ? ? ? Cain Saupe ? 01/10/2022  ?  ? '@PREFPERIOPPHARMACY'$ @ ? ? Your procedure is scheduled on  01/17/2022. ? ? Report to [facility] at  [place]. ? ? Call this number if you have problems the morning of surgery: ? [phone] ? ? Remember: ? Follow the diet and prep instructions given to you by the office. ?  ? Take these medicines the morning of surgery with A SIP OF WATER  ? ?None ?  ? Do not wear jewelry, make-up or nail polish. ? Do not wear lotions, powders, or perfumes, or deodorant. ? Do not shave 48 hours prior to surgery.  Men may shave face and neck. ? Do not bring valuables to the hospital. ? [facility] is not responsible for any belongings or valuables. ? ?Contacts, dentures or bridgework may not be worn into surgery.  Leave your suitcase in the car.  After surgery it may be brought to your room. ? ?For patients admitted to the hospital, discharge time will be determined by your treatment team. ? ?Patients discharged the day of surgery will not be allowed to drive home and must have someone with them for 24 hours.  ? ? ?Special instructions:   DO NOT smoke tobacco or vape for 24 hours before your procedure. ? ?Please read over the following fact sheets that you were given. ?Anesthesia Post-op Instructions and Care and Recovery After Surgery ?  ? ? ? Colonoscopy, Adult, Care After ?This sheet gives you information about how to care for yourself after your procedure. Your health care provider may also give you more specific instructions. If you have problems or questions, contact your health care provider. ?What can I expect after the procedure? ?After the procedure, it is common to have: ?A small amount of blood in your stool for 24 hours after the procedure. ?Some gas. ?Mild cramping or bloating of your abdomen. ?Follow these instructions at home: ?Eating and drinking ? ?Drink enough fluid to keep your urine pale yellow. ?Follow instructions from your health care provider about eating or drinking  restrictions. ?Resume your normal diet as instructed by your health care provider. Avoid heavy or fried foods that are hard to digest. ?Activity ?Rest as told by your health care provider. ?Avoid sitting for a long time without moving. Get up to take short walks every 1-2 hours. This is important to improve blood flow and breathing. Ask for help if you feel weak or unsteady. ?Return to your normal activities as told by your health care provider. Ask your health care provider what activities are safe for you. ?Managing cramping and bloating ? ?Try walking around when you have cramps or feel bloated. ?Apply heat to your abdomen as told by your health care provider. Use the heat source that your health care provider recommends, such as a moist heat pack or a heating pad. ?Place a towel between your skin and the heat source. ?Leave the heat on for 20-30 minutes. ?Remove the heat if your skin turns bright red. This is especially important if you are unable to feel pain, heat, or cold. You may have a greater risk of getting burned. ?General instructions ?If you were given a sedative during the procedure, it can affect you for several hours. Do not drive or operate machinery until your health care provider says that it is safe. ?For the first 24 hours after the procedure: ?Do not sign important documents. ?Do not drink alcohol. ?Do your regular daily  activities at a slower pace than normal. ?Eat soft foods that are easy to digest. ?Take over-the-counter and prescription medicines only as told by your health care provider. ?Keep all follow-up visits as told by your health care provider. This is important. ?Contact a health care provider if: ?You have blood in your stool 2-3 days after the procedure. ?Get help right away if you have: ?More than a small spotting of blood in your stool. ?Large blood clots in your stool. ?Swelling of your abdomen. ?Nausea or vomiting. ?A fever. ?Increasing pain in your abdomen that is not  relieved with medicine. ?Summary ?After the procedure, it is common to have a small amount of blood in your stool. You may also have mild cramping and bloating of your abdomen. ?If you were given a sedative during the procedure, it can affect you for several hours. Do not drive or operate machinery until your health care provider says that it is safe. ?Get help right away if you have a lot of blood in your stool, nausea or vomiting, a fever, or increased pain in your abdomen. ?This information is not intended to replace advice given to you by your health care provider. Make sure you discuss any questions you have with your health care provider. ?Document Revised: 07/26/2019 Document Reviewed: 04/15/2019 ?Elsevier Patient Education ? [place]. ?Monitored Anesthesia Care, Care After ?This sheet gives you information about how to care for yourself after your procedure. Your health care provider may also give you more specific instructions. If you have problems or questions, contact your health care provider. ?What can I expect after the procedure? ?After the procedure, it is common to have: ?Tiredness. ?Forgetfulness about what happened after the procedure. ?Impaired judgment for important decisions. ?Nausea or vomiting. ?Some difficulty with balance. ?Follow these instructions at home: ?For the time period you were told by your health care provider: ?  ?Rest as needed. ?Do not participate in activities where you could fall or become injured. ?Do not drive or use machinery. ?Do not drink alcohol. ?Do not take sleeping pills or medicines that cause drowsiness. ?Do not make important decisions or sign legal documents. ?Do not take care of children on your own. ?Eating and drinking ?Follow the diet that is recommended by your health care provider. ?Drink enough fluid to keep your urine pale yellow. ?If you vomit: ?Drink water, juice, or soup when you can drink without vomiting. ?Make sure you have little or no  nausea before eating solid foods. ?General instructions ?Have a responsible adult stay with you for the time you are told. It is important to have someone help care for you until you are awake and alert. ?Take over-the-counter and prescription medicines only as told by your health care provider. ?If you have sleep apnea, surgery and certain medicines can increase your risk for breathing problems. Follow instructions from your health care provider about wearing your sleep device: ?Anytime you are sleeping, including during daytime naps. ?While taking prescription pain medicines, sleeping medicines, or medicines that make you drowsy. ?Avoid smoking. ?Keep all follow-up visits as told by your health care provider. This is important. ?Contact a health care provider if: ?You keep feeling nauseous or you keep vomiting. ?You feel light-headed. ?You are still sleepy or having trouble with balance after 24 hours. ?You develop a rash. ?You have a fever. ?You have redness or swelling around the IV site. ?Get help right away if: ?You have trouble breathing. ?You have new-onset confusion at home. ?Summary ?For  several hours after your procedure, you may feel tired. You may also be forgetful and have poor judgment. ?Have a responsible adult stay with you for the time you are told. It is important to have someone help care for you until you are awake and alert. ?Rest as told. Do not drive or operate machinery. Do not drink alcohol or take sleeping pills. ?Get help right away if you have trouble breathing, or if you suddenly become confused. ?This information is not intended to replace advice given to you by your health care provider. Make sure you discuss any questions you have with your health care provider. ?Document Revised: 06/04/2020 Document Reviewed: 08/22/2019 ?Elsevier Patient Education ? [place]. ? ?

## 2022-01-13 ENCOUNTER — Encounter (HOSPITAL_COMMUNITY): Payer: Self-pay

## 2022-01-13 ENCOUNTER — Other Ambulatory Visit: Payer: Self-pay

## 2022-01-13 ENCOUNTER — Encounter (HOSPITAL_COMMUNITY)
Admission: RE | Admit: 2022-01-13 | Discharge: 2022-01-13 | Disposition: A | Discharge status: Home / Self Care | Payer: Medicare Other | Source: Ambulatory Visit | Attending: Internal Medicine | Admitting: Internal Medicine

## 2022-01-13 DIAGNOSIS — Z01810 Encounter for preprocedural cardiovascular examination: Secondary | ICD-10-CM | POA: Insufficient documentation

## 2022-01-17 ENCOUNTER — Telehealth: Payer: Self-pay | Admitting: Internal Medicine

## 2022-01-17 NOTE — Telephone Encounter (Signed)
Pt hasn't starting his prep and is scheduled for today. Please call [phone] ?

## 2022-01-17 NOTE — Telephone Encounter (Signed)
Called pt, question answered. ?

## 2022-01-17 NOTE — Telephone Encounter (Signed)
He will receive pre-op call 02/10/22. Letter placed in envelope with instructions. Envelope and Linzess samples placed at front desk. ?

## 2022-01-17 NOTE — Telephone Encounter (Signed)
PATIENT CALLED BACK AND SAID HE HAD A QUESTION  ?

## 2022-01-17 NOTE — Telephone Encounter (Signed)
He was scheduled for TCS today. ? ?Called pt, he only took one Linzess capsule and didn't mix prep. Verified he has 2 containers of prep. TCS ASA 3 w/Dr. Abbey Chatters rescheduled to 02/14/22 at 11:15am. Advised pt to come by office to pick up more Linzess samples (he needs 4 capsules but he has 3 left), new instructions and new pre-op appt letter. Advised him to come by office this week. Endo scheduler informed TCS was rescheduled.  ?

## 2022-02-08 ENCOUNTER — Telehealth: Payer: Self-pay

## 2022-02-08 NOTE — Telephone Encounter (Signed)
Returned the pt's call, no answer no vm set up ?

## 2022-02-08 NOTE — Telephone Encounter (Signed)
Pt will be coming to pick up sample for colonoscopy. ?

## 2022-02-10 ENCOUNTER — Encounter (HOSPITAL_COMMUNITY)
Admission: RE | Admit: 2022-02-10 | Discharge: 2022-02-10 | Disposition: A | Discharge status: Home / Self Care | Payer: Medicare Other | Source: Ambulatory Visit | Attending: Internal Medicine | Admitting: Internal Medicine

## 2022-02-14 ENCOUNTER — Ambulatory Visit (HOSPITAL_COMMUNITY)
Admission: RE | Admit: 2022-02-14 | Discharge: 2022-02-14 | Disposition: A | Discharge status: Home / Self Care | Payer: Medicare Other | Source: Ambulatory Visit | Attending: Internal Medicine | Admitting: Internal Medicine

## 2022-02-14 ENCOUNTER — Ambulatory Visit (HOSPITAL_COMMUNITY): Payer: Medicare Other | Admitting: Anesthesiology

## 2022-02-14 ENCOUNTER — Ambulatory Visit (HOSPITAL_BASED_OUTPATIENT_CLINIC_OR_DEPARTMENT_OTHER): Payer: Medicare Other | Admitting: Anesthesiology

## 2022-02-14 ENCOUNTER — Encounter (HOSPITAL_COMMUNITY)
Admission: RE | Disposition: A | Discharge status: Home / Self Care | Payer: Self-pay | Source: Ambulatory Visit | Attending: Internal Medicine

## 2022-02-14 ENCOUNTER — Encounter (HOSPITAL_COMMUNITY): Payer: Self-pay

## 2022-02-14 DIAGNOSIS — I739 Peripheral vascular disease, unspecified: Secondary | ICD-10-CM | POA: Diagnosis not present

## 2022-02-14 DIAGNOSIS — J449 Chronic obstructive pulmonary disease, unspecified: Secondary | ICD-10-CM | POA: Insufficient documentation

## 2022-02-14 DIAGNOSIS — Z1211 Encounter for screening for malignant neoplasm of colon: Secondary | ICD-10-CM

## 2022-02-14 DIAGNOSIS — F17210 Nicotine dependence, cigarettes, uncomplicated: Secondary | ICD-10-CM | POA: Insufficient documentation

## 2022-02-14 DIAGNOSIS — I6523 Occlusion and stenosis of bilateral carotid arteries: Secondary | ICD-10-CM | POA: Diagnosis not present

## 2022-02-14 DIAGNOSIS — Z8673 Personal history of transient ischemic attack (TIA), and cerebral infarction without residual deficits: Secondary | ICD-10-CM | POA: Diagnosis not present

## 2022-02-14 DIAGNOSIS — I10 Essential (primary) hypertension: Secondary | ICD-10-CM

## 2022-02-14 DIAGNOSIS — D128 Benign neoplasm of rectum: Secondary | ICD-10-CM | POA: Diagnosis not present

## 2022-02-14 DIAGNOSIS — K621 Rectal polyp: Secondary | ICD-10-CM

## 2022-02-14 DIAGNOSIS — Z86010 Personal history of colonic polyps: Secondary | ICD-10-CM

## 2022-02-14 HISTORY — PX: POLYPECTOMY: SHX149

## 2022-02-14 HISTORY — PX: COLONOSCOPY WITH PROPOFOL: SHX5780

## 2022-02-14 SURGERY — COLONOSCOPY WITH PROPOFOL
Anesthesia: General

## 2022-02-14 MED ORDER — PROPOFOL 10 MG/ML IV BOLUS
INTRAVENOUS | Status: DC | PRN
  Administered 2022-02-14: 100 mg via INTRAVENOUS
  Administered 2022-02-14: 40 mg via INTRAVENOUS
  Administered 2022-02-14: 20 mg via INTRAVENOUS
  Administered 2022-02-14 (×2): 50 mg via INTRAVENOUS

## 2022-02-14 MED ORDER — LACTATED RINGERS IV SOLN: INTRAVENOUS | Status: DC

## 2022-02-14 MED ORDER — LIDOCAINE HCL (CARDIAC) PF 100 MG/5ML IV SOSY
PREFILLED_SYRINGE | INTRAVENOUS | Status: DC | PRN
  Administered 2022-02-14: 50 mg via INTRAVENOUS

## 2022-02-14 NOTE — Progress Notes (Addendum)
Fleets enema done per pt request. Results yellow liquid with small amt brown sediment. No formed stool. Tolerated well. ?

## 2022-02-14 NOTE — Op Note (Signed)
[facility] ?Patient Name: Drew Cruz ?Procedure Date: 02/14/2022 9:24 AM ?MRN: [id number] ?Date of Birth: [birth year] ?Attending MD: Elon Alas. Abbey Chatters , DO ?CSN: [id number] ?Age: 66 ?Admit Type: Outpatient ?Procedure:                Colonoscopy ?Indications:              Screening for colorectal malignant neoplasm ?Providers:                Elon Alas. Abbey Chatters, DO, [place] Page, [place]                          Risa Grill, Technician ?Referring MD:              ?Medicines:                See the Anesthesia note for documentation of the  ?                          administered medications ?Complications:            No immediate complications. ?Estimated Blood Loss:     Estimated blood loss was minimal. ?Procedure:                Pre-Anesthesia Assessment: ?                          - The anesthesia plan was to use monitored  ?                          anesthesia care (MAC). ?                          After obtaining informed consent, the colonoscope  ?                          was passed under direct vision. Throughout the  ?                          procedure, the patient's blood pressure, pulse, and  ?                          oxygen saturations were monitored continuously. The  ?                          PCF-HQ190L ([id number]) scope was introduced through  ?                          the anus and advanced to the the cecum, identified  ?                          by appendiceal orifice and ileocecal valve. The  ?                          colonoscopy was performed without difficulty. The  ?                          patient tolerated the procedure well.  The quality  ?                          of the bowel preparation was evaluated using the  ?                          BBPS [facility] Bowel Preparation Scale) with scores  ?                          of: Right Colon = 3, Transverse Colon = 3 and Left  ?                          Colon = 3 (entire mucosa seen well with no residual  ?                          staining,  small fragments of stool or opaque  ?                          liquid). The total BBPS score equals 9. ?Scope In: 9:53:41 AM ?Scope Out: 10:03:50 AM ?Scope Withdrawal Time: 0 hours 8 minutes 27 seconds  ?Total Procedure Duration: 0 hours 10 minutes 9 seconds  ?Findings: ?     The perianal and digital rectal examinations were normal. ?     A 5 mm polyp was found in the rectum. The polyp was sessile. The polyp  ?     was removed with a cold snare. Resection and retrieval were complete. ?     The exam was otherwise without abnormality. ?Impression:               - One 5 mm polyp in the rectum, removed with a cold  ?                          snare. Resected and retrieved. ?                          - The examination was otherwise normal. ?Moderate Sedation: ?     Per Anesthesia Care ?Recommendation:           - Patient has a contact number available for  ?                          emergencies. The signs and symptoms of potential  ?                          delayed complications were discussed with the  ?                          patient. Return to normal activities tomorrow.  ?                          Written discharge instructions were provided to the  ?                          patient. ?                          -  Resume previous diet. ?                          - Continue present medications. ?                          - Await pathology results. ?                          - Repeat colonoscopy in 5 years for surveillance. ?                          - Return to GI clinic PRN. ?Procedure Code(s):        --- Professional --- ?                          [phone], Colonoscopy, flexible; with removal of  ?                          tumor(s), polyp(s), or other lesion(s) by snare  ?                          technique ?Diagnosis Code(s):        --- Professional --- ?                          Z12.11, Encounter for screening for malignant  ?                          neoplasm of colon ?                          K62.1, Rectal  polyp ?CPT copyright 2019 American Medical Association. All rights reserved. ?The codes documented in this report are preliminary and upon coder review may  ?be revised to meet current compliance requirements. ?Elon Alas. Abbey Chatters, DO ?Elon Alas. Abbey Chatters, DO ?02/14/2022 10:05:44 AM ?This report has been signed electronically. ?Number of Addenda: 0 ?

## 2022-02-14 NOTE — H&P (Signed)
Primary Care Physician:  Carrolyn Meiers, MD ?Primary Gastroenterologist:  Dr. Abbey Chatters ? ?Pre-Procedure History & Physical: ?HPI:  Drew Cruz is a 66 y.o. male is here for a colonoscopy to be performed for colon cancer screening purposes. His last colonoscopy was in February 2017 with 1 hyperplastic rectal polyp removed, small internal hemorrhoids, poor prep in right colon.  Recommended repeat colonoscopy in 5 years due to borderline prep. ? ?Past Medical History:  ?Diagnosis Date  ? Cataracts, bilateral   ? Hypercholesteremia   ? Hypertension   ? Pulmonary emphysema determined by X-ray ([place]) 07/04/2017  ? Stroke [facility])   ? 04/2015. left sided facial weakness.  ? Substance abuse ([place])   ? ? ?Past Surgical History:  ?Procedure Laterality Date  ? CATARACT EXTRACTION W/PHACO Right 05/25/2015  ? Procedure: CATARACT EXTRACTION PHACO AND INTRAOCULAR LENS PLACEMENT RIGHT EYE CDE=16.98;  Surgeon: Tonny Branch, MD;  Location: [facility] ORS;  Service: Ophthalmology;  Laterality: Right;  ? CATARACT EXTRACTION W/PHACO Left 06/25/2015  ? Procedure: CATARACT EXTRACTION PHACO AND INTRAOCULAR LENS PLACEMENT (IOC);  Surgeon: Tonny Branch, MD;  Location: [facility] ORS;  Service: Ophthalmology;  Laterality: Left;  CDE: 17.56  ? COLONOSCOPY WITH PROPOFOL N/A 11/24/2015  ? Surgeon: Danie Binder, MD;  1 hyperplastic rectal polyp removed, small internal hemorrhoids, poor prep in right colon.  Recommended repeat colonoscopy in 5 years due to poor prep.  ? POLYPECTOMY  11/24/2015  ? Procedure: POLYPECTOMY;  Surgeon: Danie Binder, MD;  Location: [facility] ENDO SUITE;  Service: Endoscopy;;  rectal polyp  ? ? ?Prior to Admission medications   ?Medication Sig Start Date End Date Taking? Authorizing Provider  ?amLODipine (NORVASC) 5 MG tablet Take 5 mg by mouth daily. 11/12/21  Yes [provider]  ?aspirin EC 81 MG tablet Take 81 mg by mouth daily. Swallow whole.   Yes [provider]  ?atorvastatin (LIPITOR) 80 MG tablet Take 1 tablet (80 mg  total) by mouth daily. 03/18/21  Yes Ailene Ards, NP  ?lisinopril (ZESTRIL) 20 MG tablet Take 1 tablet (20 mg total) by mouth daily. 03/18/21  Yes Ailene Ards, NP  ?tamsulosin (FLOMAX) 0.4 MG CAPS capsule Take 0.4 mg by mouth daily. 11/12/21  Yes [provider]  ?polyethylene glycol-electrolytes (TRILYTE) 420 g solution Take 4,000 mLs by mouth as directed. 12/24/21   Eloise Harman, DO  ? ? ?Allergies as of 12/24/2021  ? (No Known Allergies)  ? ? ?Family History  ?Problem Relation Age of Onset  ? Cancer Mother   ? Early death Father   ?     murder  ? Other Sister 2  ?     meningitis with resultant brain damage  ? Colon cancer Neg Hx   ? ? ?Social History  ? ?Socioeconomic History  ? Marital status: Single  ?  Spouse name: Not on file  ? Number of children: 0  ? Years of education: 62  ? Highest education level: Not on file  ?Occupational History  ? Occupation: disability  ?  Comment: "cataracts and stroke"  ?Tobacco Use  ? Smoking status: Every Day  ?  Packs/day: 0.50  ?  Years: 53.00  ?  Pack years: 26.50  ?  Types: Cigarettes  ?  Start date: 10/03/1965  ? Smokeless tobacco: Never  ? Tobacco comments:  ?  Has been unsuccessful wih quitting attempts previously  ?Vaping Use  ? Vaping Use: Never used  ?Substance and Sexual Activity  ? Alcohol use:  Yes  ?  Alcohol/week: 3.0 standard drinks  ?  Types: 3 Cans of beer per week  ?  Comment: 2-3 12 oz beer and a 40 at times daily.  ? Drug use: No  ? Sexual activity: Never  ?  Birth control/protection: None  ?Other Topics Concern  ? Not on file  ?Social History Narrative  ? Has an aid - makes sure takes meds, cooks  ? Disabled - uncertain why  ? "cant take care of myself"  ? Lives with mother and disabled sister  ? Leisure- watch TV, goes to friends house  ? ?Social Determinants of Health  ? ?Financial Resource Strain: Not on file  ?Food Insecurity: Not on file  ?Transportation Needs: Not on file  ?Physical Activity: Not on file  ?Stress: Not on file  ?Social  Connections: Not on file  ?Intimate Partner Violence: Not on file  ? ? ?Review of Systems: ?See HPI, otherwise negative ROS ? ?Physical Exam: ?Vital signs in last 24 hours: ?Temp:  [98.2 ?F (36.8 ?C)] 98.2 ?F (36.8 ?C) (05/15 0930) ?Pulse Rate:  [95] 95 (05/15 0930) ?BP: (162)/(87) 162/87 (05/15 0930) ?SpO2:  [95 %] 95 % (05/15 0930) ?  ?General:   Alert,  Well-developed, well-nourished, pleasant and cooperative in NAD ?Head:  Normocephalic and atraumatic. ?Eyes:  Sclera clear, no icterus.   Conjunctiva pink. ?Ears:  Normal auditory acuity. ?Nose:  No deformity, discharge,  or lesions. ?Mouth:  No deformity or lesions, dentition normal. ?Neck:  Supple; no masses or thyromegaly. ?Lungs:  Clear throughout to auscultation.   No wheezes, crackles, or rhonchi. No acute distress. ?Heart:  Regular rate and rhythm; no murmurs, clicks, rubs,  or gallops. ?Abdomen:  Soft, nontender and nondistended. No masses, hepatosplenomegaly or hernias noted. Normal bowel sounds, without guarding, and without rebound.   ?Msk:  Symmetrical without gross deformities. Normal posture. ?Extremities:  Without clubbing or edema. ?Neurologic:  Alert and  oriented x4;  grossly normal neurologically. ?Skin:  Intact without significant lesions or rashes. ?Cervical Nodes:  No significant cervical adenopathy. ?Psych:  Alert and cooperative. Normal mood and affect. ? ?Impression/Plan: ?Drew Cruz is here for a colonoscopy to be performed for colon cancer screening purposes. His last colonoscopy was in February 2017 with 1 hyperplastic rectal polyp removed, small internal hemorrhoids, poor prep in right colon.  Recommended repeat colonoscopy in 5 years due to borderline prep. ? ?The risks of the procedure including infection, bleed, or perforation as well as benefits, limitations, alternatives and imponderables have been reviewed with the patient. Questions have been answered. All parties agreeable. ? ?

## 2022-02-14 NOTE — Addendum Note (Signed)
Addendum  created 02/14/22 1307 by Karna Dupes, CRNA  ? Charge Capture section accepted  ?  ?

## 2022-02-14 NOTE — Discharge Instructions (Addendum)
?  Colonoscopy Discharge Instructions  Read the instructions outlined below and refer to this sheet in the next few weeks. These discharge instructions provide you with general information on caring for yourself after you leave the hospital. Your doctor may also give you specific instructions. While your treatment has been planned according to the most current medical practices available, unavoidable complications occasionally occur.   ACTIVITY You may resume your regular activity, but move at a slower pace for the next 24 hours.  Take frequent rest periods for the next 24 hours.  Walking will help get rid of the air and reduce the bloated feeling in your belly (abdomen).  No driving for 24 hours (because of the medicine (anesthesia) used during the test).   Do not sign any important legal documents or operate any machinery for 24 hours (because of the anesthesia used during the test).  NUTRITION Drink plenty of fluids.  You may resume your normal diet as instructed by your doctor.  Begin with a light meal and progress to your normal diet. Heavy or fried foods are harder to digest and may make you feel sick to your stomach (nauseated).  Avoid alcoholic beverages for 24 hours or as instructed.  MEDICATIONS You may resume your normal medications unless your doctor tells you otherwise.  WHAT YOU CAN EXPECT TODAY Some feelings of bloating in the abdomen.  Passage of more gas than usual.  Spotting of blood in your stool or on the toilet paper.  IF YOU HAD POLYPS REMOVED DURING THE COLONOSCOPY: No aspirin products for 7 days or as instructed.  No alcohol for 7 days or as instructed.  Eat a soft diet for the next 24 hours.  FINDING OUT THE RESULTS OF YOUR TEST Not all test results are available during your visit. If your test results are not back during the visit, make an appointment with your caregiver to find out the results. Do not assume everything is normal if you have not heard from your  caregiver or the medical facility. It is important for you to follow up on all of your test results.  SEEK IMMEDIATE MEDICAL ATTENTION IF: You have more than a spotting of blood in your stool.  Your belly is swollen (abdominal distention).  You are nauseated or vomiting.  You have a temperature over 101.  You have abdominal pain or discomfort that is severe or gets worse throughout the day.   Your colonoscopy revealed 1 polyp(s) which I removed successfully. Await pathology results, my office will contact you. I recommend repeating colonoscopy in 5 years for surveillance purposes. Otherwise follow up with Gi as needed.    I hope you have a great rest of your week!  Charles K. Carver, D.O. Gastroenterology and Hepatology [facility]  

## 2022-02-14 NOTE — Anesthesia Procedure Notes (Signed)
Date/Time: 02/14/2022 9:55 AM ?Performed by: Karna Dupes, CRNA ?Pre-anesthesia Checklist: Patient identified, Emergency Drugs available, Suction available and Patient being monitored ?Oxygen Delivery Method: Nasal cannula ? ? ? ? ?

## 2022-02-14 NOTE — Anesthesia Preprocedure Evaluation (Signed)
Anesthesia Evaluation  ?Patient identified by MRN, date of birth, ID band ?Patient awake ? ? ? ?Reviewed: ?Allergy & Precautions, NPO status , Patient's Chart, lab work & pertinent test results ? ?Airway ?Mallampati: II ? ?TM Distance: >3 FB ?Neck ROM: Full ? ? ? Dental ? ?(+) Dental Advisory Given, Chipped,  ?  ?Pulmonary ?COPD,  COPD inhaler, Current Smoker and Patient abstained from smoking.,  ?  ? ? ? ? ? ? ? Cardiovascular ?Exercise Tolerance: Good ?hypertension, Pt. on medications ?+ Peripheral Vascular Disease (bilateral carotid atherosclerosis)  ? ?Rhythm:Regular Rate:Normal ? ?13-Jan-2022 11:55:40 [facility] ROUTINE RECORD ?Apr 19, 1956 (32 yr) ?Male Black ?Vent. rate 111 BPM ?PR interval 144 ms ?QRS duration 100 ms ?QT/QTcB 336/456 ms ?P-R-T axes 79 83 -9 ?Sinus tachycardia ?ST & T wave abnormality, consider inferior ischemia ?Abnormal ECG ?When compared with ECG of 09-Apr-2015 10:10, Rate faster ?PREVIOUS ECG IS PRESENT ?Confirmed by Asencion Noble [phone]) on 01/16/2022 2:26:50 PM ?  ?Neuro/Psych ?CVA, Residual Symptoms negative psych ROS  ? GI/Hepatic ?negative GI ROS, (+)  ?  ? substance abuse (3 to 4 drinks/day) ? alcohol use,   ?Endo/Other  ?negative endocrine ROS ? Renal/GU ?negative Renal ROS  ?negative genitourinary ?  ?Musculoskeletal ?negative musculoskeletal ROS ?(+)  ? Abdominal ?  ?Peds ?negative pediatric ROS ?(+)  Hematology ?negative hematology ROS ?(+)   ?Anesthesia Other Findings ? ? Reproductive/Obstetrics ?negative OB ROS ? ?  ? ? ? ? ? ? ? ? ? ? ? ? ? ?  ?  ? ? ? ? ? ?Anesthesia Physical ?Anesthesia Plan ? ?ASA: 3 ? ?Anesthesia Plan: General  ? ?Post-op Pain Management: Minimal or no pain anticipated  ? ?Induction: Intravenous ? ?PONV Risk Score and Plan: Propofol infusion ? ?Airway Management Planned: Nasal Cannula and Natural Airway ? ?Additional Equipment:  ? ?Intra-op Plan:  ? ?Post-operative Plan:  ? ?Informed Consent: I have  reviewed the patients History and Physical, chart, labs and discussed the procedure including the risks, benefits and alternatives for the proposed anesthesia with the patient or authorized representative who has indicated his/her understanding and acceptance.  ? ? ? ?Dental advisory given ? ?Plan Discussed with: CRNA and Surgeon ? ?Anesthesia Plan Comments:   ? ? ? ? ? ? ?Anesthesia Quick Evaluation ? ?

## 2022-02-14 NOTE — Transfer of Care (Signed)
Immediate Anesthesia Transfer of Care Note ? ?Patient: Drew Cruz ? ?Procedure(s) Performed: COLONOSCOPY WITH PROPOFOL ?POLYPECTOMY INTESTINAL ? ?Patient Location: Short Stay ? ?Anesthesia Type:General ? ?Level of Consciousness: awake ? ?Airway & Oxygen Therapy: Patient Spontanous Breathing ? ?Post-op Assessment: Report given to RN and Post -op Vital signs reviewed and stable ? ?Post vital signs: Reviewed and stable ? ?Last Vitals:  ?Vitals Value Taken Time  ?BP 97/67 02/14/22 1009  ?Temp    ?Pulse 91 02/14/22 1009  ?Resp 18 02/14/22 1009  ?SpO2 97 % 02/14/22 1009  ? ? ?Last Pain:  ?Vitals:  ? 02/14/22 1009  ?PainSc: 0-No pain  ?   ? ?  ? ?Complications: No notable events documented. ?

## 2022-02-14 NOTE — Anesthesia Postprocedure Evaluation (Signed)
Anesthesia Post Note ? ?Patient: Drew Cruz ? ?Procedure(s) Performed: COLONOSCOPY WITH PROPOFOL ?POLYPECTOMY INTESTINAL ? ?Patient location during evaluation: Phase II ?Anesthesia Type: General ?Level of consciousness: awake and alert and oriented ?Pain management: pain level controlled ?Vital Signs Assessment: post-procedure vital signs reviewed and stable ?Respiratory status: spontaneous breathing, nonlabored ventilation and respiratory function stable ?Cardiovascular status: blood pressure returned to baseline and stable ?Postop Assessment: no apparent nausea or vomiting ?Anesthetic complications: no ? ? ?No notable events documented. ? ? ?Last Vitals:  ?Vitals:  ? 02/14/22 1009 02/14/22 1011  ?BP: 97/67 108/71  ?Pulse: 91   ?Resp: 18   ?Temp:    ?SpO2: 97%   ?  ?Last Pain:  ?Vitals:  ? 02/14/22 1009  ?PainSc: 0-No pain  ? ? ?  ?  ?  ?  ?  ?  ? ?Crystie Yanko C Raylon Lamson ? ? ? ? ?

## 2022-02-15 LAB — SURGICAL PATHOLOGY

## 2022-02-21 ENCOUNTER — Encounter (HOSPITAL_COMMUNITY): Payer: Self-pay | Admitting: Internal Medicine

## 2022-03-02 ENCOUNTER — Other Ambulatory Visit (HOSPITAL_COMMUNITY)
Admission: RE | Admit: 2022-03-02 | Discharge: 2022-03-02 | Disposition: A | Discharge status: Home / Self Care | Payer: Medicare Other | Source: Ambulatory Visit | Attending: Internal Medicine | Admitting: Internal Medicine

## 2022-03-02 DIAGNOSIS — E785 Hyperlipidemia, unspecified: Secondary | ICD-10-CM | POA: Diagnosis not present

## 2022-03-02 DIAGNOSIS — Z1159 Encounter for screening for other viral diseases: Secondary | ICD-10-CM | POA: Diagnosis present

## 2022-03-02 DIAGNOSIS — I10 Essential (primary) hypertension: Secondary | ICD-10-CM | POA: Insufficient documentation

## 2022-03-02 DIAGNOSIS — N400 Enlarged prostate without lower urinary tract symptoms: Secondary | ICD-10-CM | POA: Diagnosis not present

## 2022-03-02 DIAGNOSIS — Z125 Encounter for screening for malignant neoplasm of prostate: Secondary | ICD-10-CM | POA: Insufficient documentation

## 2022-03-02 LAB — CBC WITH DIFFERENTIAL/PLATELET
Abs Immature Granulocytes: 0.02 10*3/uL (ref 0.00–0.07)
Basophils Absolute: 0 10*3/uL (ref 0.0–0.1)
Basophils Relative: 0 %
Eosinophils Absolute: 0 10*3/uL (ref 0.0–0.5)
Eosinophils Relative: 0 %
HCT: 38.1 % — ABNORMAL LOW (ref 39.0–52.0)
Hemoglobin: 13.4 g/dL (ref 13.0–17.0)
Immature Granulocytes: 0 %
Lymphocytes Relative: 25 %
Lymphs Abs: 1.3 10*3/uL (ref 0.7–4.0)
MCH: 31.1 pg (ref 26.0–34.0)
MCHC: 35.2 g/dL (ref 30.0–36.0)
MCV: 88.4 fL (ref 80.0–100.0)
Monocytes Absolute: 0.3 10*3/uL (ref 0.1–1.0)
Monocytes Relative: 6 %
Neutro Abs: 3.5 10*3/uL (ref 1.7–7.7)
Neutrophils Relative %: 69 %
Platelets: 277 10*3/uL (ref 150–400)
RBC: 4.31 MIL/uL (ref 4.22–5.81)
RDW: 14.2 % (ref 11.5–15.5)
WBC: 5.1 10*3/uL (ref 4.0–10.5)
nRBC: 0 % (ref 0.0–0.2)

## 2022-03-02 LAB — HEPATIC FUNCTION PANEL
ALT: 26 U/L (ref 0–44)
AST: 22 U/L (ref 15–41)
Albumin: 4.1 g/dL (ref 3.5–5.0)
Alkaline Phosphatase: 54 U/L (ref 38–126)
Bilirubin, Direct: 0.1 mg/dL (ref 0.0–0.2)
Indirect Bilirubin: 0.7 mg/dL (ref 0.3–0.9)
Total Bilirubin: 0.8 mg/dL (ref 0.3–1.2)
Total Protein: 7.7 g/dL (ref 6.5–8.1)

## 2022-03-02 LAB — BASIC METABOLIC PANEL
Anion gap: 9 (ref 5–15)
BUN: 15 mg/dL (ref 8–23)
CO2: 23 mmol/L (ref 22–32)
Calcium: 9 mg/dL (ref 8.9–10.3)
Chloride: 106 mmol/L (ref 98–111)
Creatinine, Ser: 0.75 mg/dL (ref 0.61–1.24)
GFR, Estimated: >60 mL/min (ref >60)
Glucose, Bld: 80 mg/dL (ref 70–99)
Potassium: 4.1 mmol/L (ref 3.5–5.1)
Sodium: 138 mmol/L (ref 135–145)

## 2022-03-02 LAB — LIPID PANEL
Cholesterol: 172 mg/dL (ref 0–200)
HDL: 104 mg/dL (ref >40)
LDL Cholesterol: 55 mg/dL (ref 0–99)
Total CHOL/HDL Ratio: 1.7 RATIO
Triglycerides: 65 mg/dL (ref <150)
VLDL: 13 mg/dL (ref 0–40)

## 2022-03-02 LAB — PSA: Prostatic Specific Antigen: 4.73 ng/mL — ABNORMAL HIGH (ref 0.00–4.00)

## 2022-03-03 ENCOUNTER — Telehealth: Payer: Self-pay | Admitting: Nurse Practitioner

## 2022-03-03 LAB — HCV AB W REFLEX TO QUANT PCR: HCV Ab: NONREACTIVE

## 2022-03-03 LAB — HCV INTERPRETATION

## 2022-03-03 NOTE — Telephone Encounter (Signed)
Patient used to be a primary care patient of mine, but has transition to a new PCP since my previous office is closed.  I had a reminder in the medical record system regarding the fact that he is due for repeat CT of the chest for lung cancer screening.  Medical assistant was notified and she call patient to notify him that he is due for this repeat CT scan.  He reported that he is aware and will let his new PCP know about this.

## 2022-03-21 NOTE — Addendum Note (Signed)
Encounter addended by: Annie Paras on: 03/21/2022 3:40 PM  Actions taken: Letter saved

## 2022-08-08 ENCOUNTER — Other Ambulatory Visit (HOSPITAL_COMMUNITY): Payer: Self-pay | Admitting: Gerontology

## 2022-08-08 DIAGNOSIS — Z87891 Personal history of nicotine dependence: Secondary | ICD-10-CM

## 2022-08-22 ENCOUNTER — Other Ambulatory Visit (HOSPITAL_COMMUNITY)
Admission: RE | Admit: 2022-08-22 | Discharge: 2022-08-22 | Disposition: A | Discharge status: Home / Self Care | Payer: Medicare Other | Source: Ambulatory Visit | Attending: Internal Medicine | Admitting: Internal Medicine

## 2022-08-22 DIAGNOSIS — Z0001 Encounter for general adult medical examination with abnormal findings: Secondary | ICD-10-CM | POA: Insufficient documentation

## 2022-08-22 DIAGNOSIS — R972 Elevated prostate specific antigen [PSA]: Secondary | ICD-10-CM | POA: Insufficient documentation

## 2022-08-22 DIAGNOSIS — I10 Essential (primary) hypertension: Secondary | ICD-10-CM | POA: Insufficient documentation

## 2022-08-22 DIAGNOSIS — Z125 Encounter for screening for malignant neoplasm of prostate: Secondary | ICD-10-CM | POA: Diagnosis not present

## 2022-08-22 LAB — CBC WITH DIFFERENTIAL/PLATELET
Abs Immature Granulocytes: 0.01 10*3/uL (ref 0.00–0.07)
Basophils Absolute: 0 10*3/uL (ref 0.0–0.1)
Basophils Relative: 0 %
Eosinophils Absolute: 0 10*3/uL (ref 0.0–0.5)
Eosinophils Relative: 0 %
HCT: 37.1 % — ABNORMAL LOW (ref 39.0–52.0)
Hemoglobin: 12.9 g/dL — ABNORMAL LOW (ref 13.0–17.0)
Immature Granulocytes: 0 %
Lymphocytes Relative: 27 %
Lymphs Abs: 1.3 10*3/uL (ref 0.7–4.0)
MCH: 31.1 pg (ref 26.0–34.0)
MCHC: 34.8 g/dL (ref 30.0–36.0)
MCV: 89.4 fL (ref 80.0–100.0)
Monocytes Absolute: 0.4 10*3/uL (ref 0.1–1.0)
Monocytes Relative: 8 %
Neutro Abs: 3.1 10*3/uL (ref 1.7–7.7)
Neutrophils Relative %: 65 %
Platelets: 279 10*3/uL (ref 150–400)
RBC: 4.15 MIL/uL — ABNORMAL LOW (ref 4.22–5.81)
RDW: 13.3 % (ref 11.5–15.5)
WBC: 4.8 10*3/uL (ref 4.0–10.5)
nRBC: 0 % (ref 0.0–0.2)

## 2022-08-22 LAB — HEPATIC FUNCTION PANEL
ALT: 31 U/L (ref 0–44)
AST: 25 U/L (ref 15–41)
Albumin: 4.2 g/dL (ref 3.5–5.0)
Alkaline Phosphatase: 61 U/L (ref 38–126)
Bilirubin, Direct: 0.1 mg/dL (ref 0.0–0.2)
Indirect Bilirubin: 0.4 mg/dL (ref 0.3–0.9)
Total Bilirubin: 0.5 mg/dL (ref 0.3–1.2)
Total Protein: 7.9 g/dL (ref 6.5–8.1)

## 2022-08-22 LAB — LIPID PANEL
Cholesterol: 188 mg/dL (ref 0–200)
HDL: 112 mg/dL (ref >40)
LDL Cholesterol: 69 mg/dL (ref 0–99)
Total CHOL/HDL Ratio: 1.7 RATIO
Triglycerides: 35 mg/dL (ref <150)
VLDL: 7 mg/dL (ref 0–40)

## 2022-08-22 LAB — BASIC METABOLIC PANEL
Anion gap: 6 (ref 5–15)
BUN: 13 mg/dL (ref 8–23)
CO2: 26 mmol/L (ref 22–32)
Calcium: 9.1 mg/dL (ref 8.9–10.3)
Chloride: 109 mmol/L (ref 98–111)
Creatinine, Ser: 0.68 mg/dL (ref 0.61–1.24)
GFR, Estimated: >60 mL/min (ref >60)
Glucose, Bld: 91 mg/dL (ref 70–99)
Potassium: 4.2 mmol/L (ref 3.5–5.1)
Sodium: 141 mmol/L (ref 135–145)

## 2022-08-22 LAB — PSA: Prostatic Specific Antigen: 5.94 ng/mL — ABNORMAL HIGH (ref 0.00–4.00)

## 2022-09-06 ENCOUNTER — Ambulatory Visit (HOSPITAL_COMMUNITY): Admission: RE | Admit: 2022-09-06 | Payer: Medicare Other | Source: Ambulatory Visit

## 2022-09-06 ENCOUNTER — Encounter (HOSPITAL_COMMUNITY): Payer: Self-pay

## 2022-10-04 ENCOUNTER — Other Ambulatory Visit: Payer: Self-pay

## 2022-10-04 DIAGNOSIS — Z122 Encounter for screening for malignant neoplasm of respiratory organs: Secondary | ICD-10-CM

## 2022-10-04 DIAGNOSIS — Z87891 Personal history of nicotine dependence: Secondary | ICD-10-CM

## 2022-10-04 NOTE — Progress Notes (Signed)
Order placed for LDCT per protocol. Scan scheduled for 11/10/22 at 1:45p, patient aware

## 2022-11-10 ENCOUNTER — Ambulatory Visit (HOSPITAL_COMMUNITY)
Admission: RE | Admit: 2022-11-10 | Discharge: 2022-11-10 | Disposition: A | Discharge status: Home / Self Care | Payer: Medicare Other | Source: Ambulatory Visit | Attending: Physician Assistant | Admitting: Physician Assistant

## 2022-11-10 DIAGNOSIS — Z122 Encounter for screening for malignant neoplasm of respiratory organs: Secondary | ICD-10-CM | POA: Diagnosis present

## 2022-11-10 DIAGNOSIS — Z87891 Personal history of nicotine dependence: Secondary | ICD-10-CM | POA: Diagnosis present

## 2022-11-14 NOTE — Progress Notes (Signed)
Patient notified of LDCT Lung Cancer Screening Results via mail with the recommendation to follow-up in 12 months. Patient's referring provider has been sent a copy of results. Results are as follows:  IMPRESSION: 1. Lung-RADS 2S, benign appearance or behavior. Continue annual screening with low-dose chest CT without contrast in 12 months. 2. The "S" modifier above refers to potentially clinically significant non lung cancer related findings. Specifically, there is aortic atherosclerosis, in addition to left main and left anterior descending coronary artery disease. Please note that although the presence of coronary artery calcium documents the presence of coronary artery disease, the severity of this disease and any potential stenosis cannot be assessed on this non-gated CT examination. Assessment for potential risk factor modification, dietary therapy or pharmacologic therapy may be warranted, if clinically indicated. 3. Mild diffuse bronchial wall thickening with mild centrilobular and paraseptal emphysema; imaging findings suggestive of underlying COPD.   Aortic Atherosclerosis (ICD10-I70.0) and Emphysema (ICD10-J43.9).

## 2023-08-16 ENCOUNTER — Encounter (HOSPITAL_COMMUNITY): Payer: Self-pay | Admitting: Gerontology

## 2023-08-16 DIAGNOSIS — F17208 Nicotine dependence, unspecified, with other nicotine-induced disorders: Secondary | ICD-10-CM

## 2023-08-17 ENCOUNTER — Other Ambulatory Visit (HOSPITAL_COMMUNITY): Payer: Self-pay | Admitting: Gerontology

## 2023-08-17 DIAGNOSIS — F17210 Nicotine dependence, cigarettes, uncomplicated: Secondary | ICD-10-CM

## 2023-08-17 DIAGNOSIS — F17200 Nicotine dependence, unspecified, uncomplicated: Secondary | ICD-10-CM

## 2023-11-13 ENCOUNTER — Ambulatory Visit (HOSPITAL_COMMUNITY): Admission: RE | Admit: 2023-11-13 | Payer: Medicare Other | Source: Ambulatory Visit

## 2023-11-13 ENCOUNTER — Encounter (HOSPITAL_COMMUNITY): Payer: Self-pay

## 2023-11-14 NOTE — Progress Notes (Signed)
Patient did not arrive for scheduled LDCT appointment. Attempted to call patient to reschedule appointment, unable to reach patient directly. No VM available.

## 2023-12-15 NOTE — Progress Notes (Signed)
 Patient did not arrive for scheduled LDCT appointment. Attempted to call patient to reschedule appointment, unable to reach patient directly. No VM available as the number is no longer in service.

## 2024-01-08 NOTE — Progress Notes (Signed)
 Attempted to reach patient to reschedule LDCT. Unable to reach patient directly as the number is no longer in service.

## 2024-04-03 ENCOUNTER — Other Ambulatory Visit: Payer: Self-pay | Admitting: *Deleted

## 2024-04-03 DIAGNOSIS — Z87891 Personal history of nicotine dependence: Secondary | ICD-10-CM

## 2024-04-03 DIAGNOSIS — Z122 Encounter for screening for malignant neoplasm of respiratory organs: Secondary | ICD-10-CM

## 2024-04-03 NOTE — Progress Notes (Signed)
 Orders placed for follow-up LDCT screening exam.  Contacted patient's contact and she advised that her number is the patients current phone number.  We were able to get his scan scheduled and she is aware of date and time and will have him there.

## 2024-04-30 ENCOUNTER — Ambulatory Visit (HOSPITAL_COMMUNITY)
Admission: RE | Admit: 2024-04-30 | Discharge: 2024-04-30 | Disposition: A | Discharge status: Home / Self Care | Source: Ambulatory Visit | Attending: Oncology | Admitting: Oncology

## 2024-04-30 DIAGNOSIS — Z87891 Personal history of nicotine dependence: Secondary | ICD-10-CM | POA: Insufficient documentation

## 2024-04-30 DIAGNOSIS — Z122 Encounter for screening for malignant neoplasm of respiratory organs: Secondary | ICD-10-CM | POA: Insufficient documentation

## 2024-08-16 ENCOUNTER — Encounter: Payer: Self-pay | Admitting: Gerontology

## 2024-10-17 ENCOUNTER — Other Ambulatory Visit: Payer: Self-pay

## 2024-10-17 DIAGNOSIS — Z87891 Personal history of nicotine dependence: Secondary | ICD-10-CM

## 2024-10-17 DIAGNOSIS — F17210 Nicotine dependence, cigarettes, uncomplicated: Secondary | ICD-10-CM

## 2024-10-17 DIAGNOSIS — Z122 Encounter for screening for malignant neoplasm of respiratory organs: Secondary | ICD-10-CM
